# Patient Record
Sex: Male | Born: 1965 | Race: Black or African American | Hispanic: No | Marital: Single | Smoking: Never smoker
Health system: Southern US, Community
[De-identification: ages and names within clinical notes are randomized; demographics above are authoritative.]

## PROBLEM LIST (undated history)

## (undated) DIAGNOSIS — M1990 Unspecified osteoarthritis, unspecified site: Secondary | ICD-10-CM

## (undated) DIAGNOSIS — E559 Vitamin D deficiency, unspecified: Secondary | ICD-10-CM

## (undated) DIAGNOSIS — K219 Gastro-esophageal reflux disease without esophagitis: Secondary | ICD-10-CM

## (undated) DIAGNOSIS — T7840XA Allergy, unspecified, initial encounter: Secondary | ICD-10-CM

## (undated) DIAGNOSIS — R7303 Prediabetes: Secondary | ICD-10-CM

## (undated) DIAGNOSIS — E785 Hyperlipidemia, unspecified: Secondary | ICD-10-CM

## (undated) HISTORY — DX: Vitamin D deficiency, unspecified: E55.9

## (undated) HISTORY — DX: Unspecified osteoarthritis, unspecified site: M19.90

## (undated) HISTORY — PX: OTHER SURGICAL HISTORY: SHX169

## (undated) HISTORY — PX: ORBITAL FRACTURE SURGERY: SHX725

## (undated) HISTORY — DX: Allergy, unspecified, initial encounter: T78.40XA

## (undated) HISTORY — DX: Gastro-esophageal reflux disease without esophagitis: K21.9

## (undated) HISTORY — DX: Hyperlipidemia, unspecified: E78.5

## (undated) HISTORY — DX: Prediabetes: R73.03

---

## 2013-08-31 ENCOUNTER — Encounter: Payer: Self-pay | Admitting: Physician Assistant

## 2013-09-01 ENCOUNTER — Ambulatory Visit (HOSPITAL_COMMUNITY)
Admission: RE | Admit: 2013-09-01 | Discharge: 2013-09-01 | Disposition: A | Discharge status: Home / Self Care | Payer: BC Managed Care – PPO | Source: Ambulatory Visit | Attending: Physician Assistant | Admitting: Physician Assistant

## 2013-09-01 ENCOUNTER — Encounter: Payer: Self-pay | Admitting: Physician Assistant

## 2013-09-01 ENCOUNTER — Ambulatory Visit (INDEPENDENT_AMBULATORY_CARE_PROVIDER_SITE_OTHER): Payer: BC Managed Care – PPO | Admitting: Physician Assistant

## 2013-09-01 VITALS — BP 120/68 | HR 72 | Temp 98.8°F | Resp 16 | Ht 69.0 in | Wt 178.0 lb

## 2013-09-01 DIAGNOSIS — G4710 Hypersomnia, unspecified: Secondary | ICD-10-CM

## 2013-09-01 DIAGNOSIS — Z1211 Encounter for screening for malignant neoplasm of colon: Secondary | ICD-10-CM

## 2013-09-01 DIAGNOSIS — E559 Vitamin D deficiency, unspecified: Secondary | ICD-10-CM | POA: Insufficient documentation

## 2013-09-01 DIAGNOSIS — M1990 Unspecified osteoarthritis, unspecified site: Secondary | ICD-10-CM

## 2013-09-01 DIAGNOSIS — E785 Hyperlipidemia, unspecified: Secondary | ICD-10-CM | POA: Insufficient documentation

## 2013-09-01 DIAGNOSIS — I10 Essential (primary) hypertension: Secondary | ICD-10-CM | POA: Insufficient documentation

## 2013-09-01 DIAGNOSIS — Z0000 Encounter for general adult medical examination without abnormal findings: Secondary | ICD-10-CM

## 2013-09-01 DIAGNOSIS — Z79899 Other long term (current) drug therapy: Secondary | ICD-10-CM

## 2013-09-01 DIAGNOSIS — G4730 Sleep apnea, unspecified: Secondary | ICD-10-CM

## 2013-09-01 DIAGNOSIS — R7303 Prediabetes: Secondary | ICD-10-CM

## 2013-09-01 LAB — CBC WITH DIFFERENTIAL/PLATELET
BASOS PCT: 3 % — AB (ref 0–1)
Basophils Absolute: 0.1 10*3/uL (ref 0.0–0.1)
EOS PCT: 8 % — AB (ref 0–5)
Eosinophils Absolute: 0.3 10*3/uL (ref 0.0–0.7)
HEMATOCRIT: 40.3 % (ref 39.0–52.0)
HEMOGLOBIN: 13.9 g/dL (ref 13.0–17.0)
Lymphocytes Relative: 36 % (ref 12–46)
Lymphs Abs: 1.6 10*3/uL (ref 0.7–4.0)
MCH: 30.5 pg (ref 26.0–34.0)
MCHC: 34.5 g/dL (ref 30.0–36.0)
MCV: 88.4 fL (ref 78.0–100.0)
Monocytes Absolute: 0.4 10*3/uL (ref 0.1–1.0)
Monocytes Relative: 8 % (ref 3–12)
Neutro Abs: 2 10*3/uL (ref 1.7–7.7)
Neutrophils Relative %: 45 % (ref 43–77)
Platelets: 227 10*3/uL (ref 150–400)
RBC: 4.56 MIL/uL (ref 4.22–5.81)
RDW: 13.3 % (ref 11.5–15.5)
WBC: 4.3 10*3/uL (ref 4.0–10.5)

## 2013-09-01 MED ORDER — MELOXICAM 15 MG PO TABS: 15.0000 mg | ORAL_TABLET | Freq: Every day | ORAL | Status: DC

## 2013-09-01 MED ORDER — ALPRAZOLAM 0.5 MG PO TABS: ORAL_TABLET | ORAL | Status: DC

## 2013-09-01 NOTE — Patient Instructions (Addendum)
Cholesterol Cholesterol is a white, waxy, fat-like protein needed by your body in small amounts. The liver makes all the cholesterol you need. It is carried from the liver by the blood through the blood vessels. Deposits (plaque) may build up on blood vessel walls. This makes the arteries narrower and stiffer. Plaque increases the risk for heart attack and stroke. You cannot feel your cholesterol level even if it is very high. The only way to know is by a blood test to check your lipid (fats) levels. Once you know your cholesterol levels, you should keep a record of the test results. Work with your caregiver to to keep your levels in the desired range. WHAT THE RESULTS MEAN:  Total cholesterol is a rough measure of all the cholesterol in your blood.  LDL is the so-called bad cholesterol. This is the type that deposits cholesterol in the walls of the arteries. You want this level to be low.  HDL is the good cholesterol because it cleans the arteries and carries the LDL away. You want this level to be high.  Triglycerides are fat that the body can either burn for energy or store. High levels are closely linked to heart disease. DESIRED LEVELS:  Total cholesterol below 200.  LDL below 100 for people at risk, below 70 for very high risk.  HDL above 50 is good, above 60 is best.  Triglycerides below 150. HOW TO LOWER YOUR CHOLESTEROL:  Diet.  Choose fish or white meat chicken and [place], roasted or baked. Limit fatty cuts of red meat, fried foods, and processed meats, such as sausage and lunch meat.  Eat lots of fresh fruits and vegetables. Choose whole grains, beans, pasta, potatoes and cereals.  Use only small amounts of olive, corn or canola oils. Avoid butter, mayonnaise, shortening or palm kernel oils. Avoid foods with trans-fats.  Use skim/nonfat milk and low-fat/nonfat yogurt and cheeses. Avoid whole milk, cream, ice cream, egg yolks and cheeses. Healthy desserts include angel food  cake, ginger snaps, animal crackers, hard candy, popsicles, and low-fat/nonfat frozen yogurt. Avoid pastries, cakes, pies and cookies.  Exercise.  A regular program helps decrease LDL and raises HDL.  Helps with weight control.  Do things that increase your activity level like gardening, walking, or taking the stairs.  Medication.  May be prescribed by your caregiver to help lowering cholesterol and the risk for heart disease.  You may need medicine even if your levels are normal if you have several risk factors. HOME CARE INSTRUCTIONS   Follow your diet and exercise programs as suggested by your caregiver.  Take medications as directed.  Have blood work done when your caregiver feels it is necessary. MAKE SURE YOU:   Understand these instructions.  Will watch your condition.  Will get help right away if you are not doing well or get worse. Document Released: 04/23/2001 Document Revised: 10/21/2011 Document Reviewed: 05/12/2013 [facility] Patient Information 2014 [place], [place]. Sleep Apnea  Sleep apnea is a sleep disorder characterized by abnormal pauses in breathing while you sleep. When your breathing pauses, the level of oxygen in your blood decreases. This causes you to move out of deep sleep and into light sleep. As a result, your quality of sleep is poor, and the system that carries your blood throughout your body (cardiovascular system) experiences stress. If sleep apnea remains untreated, the following conditions can develop:  High blood pressure (hypertension).  Coronary artery disease.  Inability to achieve or maintain an erection (impotence).  Impairment  of your thought process (cognitive dysfunction). There are three types of sleep apnea: 1. Obstructive sleep apnea Pauses in breathing during sleep because of a blocked airway. 2. Central sleep apnea Pauses in breathing during sleep because the area of the brain that controls your breathing does not send the  correct signals to the muscles that control breathing. 3. Mixed sleep apnea A combination of both obstructive and central sleep apnea. RISK FACTORS The following risk factors can increase your risk of developing sleep apnea:  Being overweight.  Smoking.  Having narrow passages in your nose and throat.  Being of older age.  Being male.  Alcohol use.  Sedative and tranquilizer use.  Ethnicity. Among individuals younger than 35 years, African Americans are at increased risk of sleep apnea. SYMPTOMS   Difficulty staying asleep.  Daytime sleepiness and fatigue.  Loss of energy.  Irritability.  Loud, heavy snoring.  Morning headaches.  Trouble concentrating.  Forgetfulness.  Decreased interest in sex. DIAGNOSIS  In order to diagnose sleep apnea, your caregiver will perform a physical examination. Your caregiver may suggest that you take a home sleep test. Your caregiver may also recommend that you spend the night in a sleep lab. In the sleep lab, several monitors record information about your heart, lungs, and brain while you sleep. Your leg and arm movements and blood oxygen level are also recorded. TREATMENT The following actions may help to resolve mild sleep apnea:  Sleeping on your side.   Using a decongestant if you have nasal congestion.   Avoiding the use of depressants, including alcohol, sedatives, and narcotics.   Losing weight and modifying your diet if you are overweight. There also are devices and treatments to help open your airway:  Oral appliances. These are custom-made mouthpieces that shift your lower jaw forward and slightly open your bite. This opens your airway.  Devices that create positive airway pressure. This positive pressure "splints" your airway open to help you breathe better during sleep. The following devices create positive airway pressure:  Continuous positive airway pressure (CPAP) device. The CPAP device creates a continuous  level of air pressure with an air pump. The air is delivered to your airway through a mask while you sleep. This continuous pressure keeps your airway open.  Nasal expiratory positive airway pressure (EPAP) device. The EPAP device creates positive air pressure as you exhale. The device consists of single-use valves, which are inserted into each nostril and held in place by adhesive. The valves create very little resistance when you inhale but create much more resistance when you exhale. That increased resistance creates the positive airway pressure. This positive pressure while you exhale keeps your airway open, making it easier to breath when you inhale again.  Bilevel positive airway pressure (BPAP) device. The BPAP device is used mainly in patients with central sleep apnea. This device is similar to the CPAP device because it also uses an air pump to deliver continuous air pressure through a mask. However, with the BPAP machine, the pressure is set at two different levels. The pressure when you exhale is lower than the pressure when you inhale.  Surgery. Typically, surgery is only done if you cannot comply with less invasive treatments or if the less invasive treatments do not improve your condition. Surgery involves removing excess tissue in your airway to create a wider passage way. Document Released: 07/19/2002 Document Revised: 11/23/2012 Document Reviewed: 12/05/2011 [facility] Patient Information 2014 [place].

## 2013-09-01 NOTE — Progress Notes (Signed)
Complete Physical HPI Patient presents for complete physical.   Patient's blood pressure has been controlled at home. Patient denies chest pain, shortness of breath, dizziness. BP: 120/68 mmHg  Patient's cholesterol is diet controlled. The patient's cholesterol last visit was LDL 118.  The patient has been working on diet and exercise for prediabetes, denies changes in vision, polys, and paresthesias. Last A1C in office was A1C 5.8.  He has been working two jobs and only getting 2-3 hours of sleep at times. He has a hard time getting to sleep with difference swing shifts and wakes himself up gasping for air and snoring sometimes. He has fatigue during the day.  Has arthritis and mobic helps.   Current Medications:  Current Outpatient Prescriptions on File Prior to Visit  Medication Sig Dispense Refill  . meloxicam (MOBIC) 15 MG tablet Take 15 mg by mouth daily.       No current facility-administered medications on file prior to visit.   Health Maintenance:  Tetanus: 2010  Pneumovax: N/A Flu vaccine: N/A Zostavax: N/A DEXA: N/A Colonoscopy: N/A EGD: N/A  Allergies: No Known Allergies Medical History:  Past Medical History  Diagnosis Date  . Prediabetes   . Vitamin D deficiency   . Arthritis   . Hyperlipidemia    Surgical History:  Past Surgical History  Procedure Laterality Date  . Orbital fracture surgery Left    Family History:  Family History  Problem Relation Age of Onset  . Arthritis Mother    Social History:  History   Social History  . Marital Status: Single    Spouse Name: N/A    Number of Children: N/A  . Years of Education: N/A   Occupational History  . Not on file.   Social History Main Topics  . Smoking status: Never Smoker   . Smokeless tobacco: Never Used  . Alcohol Use: Yes     Comment: weekends  . Drug Use: No  . Sexual Activity: Not on file   Other Topics Concern  . Not on file   Social History Narrative  . No narrative on file    ROS Constitutional: + insomnia Denies weight loss/gain, headaches, insomnia, fatigue, night sweats, and change in appetite. Eyes: Denies redness, blurred vision, diplopia, discharge, itchy, watery eyes.  ENT: + snoring and will wake himself up  Denies discharge, congestion, post nasal drip, sore throat, earache, hearing loss, dental pain, Tinnitus, Vertigo, Sinus pain Cardio: Denies chest pain, palpitations, irregular heartbeat, dyspnea, diaphoresis, orthopnea, PND, claudication, edema Respiratory: denies cough, dyspnea, pleurisy, hoarseness, wheezing.  Gastrointestinal: Denies dysphagia, heartburn, pain, cramps, nausea, vomiting, bloating, diarrhea, constipation, hematemesis, melena, hematochezia, hemorrhoids Genitourinary: + ED intermittently Denies dysuria, frequency, urgency, nocturia, hesitancy, discharge, hematuria, flank pain Musculoskeletal: + muscle aches, + knee and joint pain from his job  Denies arthralgia, myalgia, stiffness, Jt. Swelling, pain, Skin: Denies pruritis, rash, hives,  acne, eczema, changing in skin lesion Neuro: Denies Weakness, tremor, incoordination, spasms, paresthesia, pain Psychiatric: Denies confusion, memory loss, sensory loss Endocrine: Denies change in weight, skin, hair change, nocturia, and paresthesia, Diabetic Denies Polys, visual blurring, hyper /hypo glycemic episodes.  Heme/Lymph: Denies Excessive bleeding, bruising, enlarged lymph nodes  Physical Exam: Estimated body mass index is 26.27 kg/(m^2) as calculated from the following:   Height as of this encounter: 5\' 9"  (1.753 m).   Weight as of this encounter: 178 lb (80.74 kg). Filed Vitals:   09/01/13 1418  BP: 120/68  Pulse: 72  Temp: 98.8 F (37.1 C)  Resp:  16   General Appearance: Well nourished, in no apparent distress. Eyes: PERRLA, EOMs, conjunctiva no swelling or erythema, normal fundi and vessels. Sinuses: No Frontal/maxillary tenderness ENT/Mouth: Ext aud canals clear, normal light  reflex with TMs without erythema, bulging. Good dentition. No erythema, swelling, or exudate on post pharynx. Tonsils not swollen or erythematous. Hearing normal. Very crowded mouth.  Neck: Supple, thyroid normal. No bruits Respiratory: Respiratory effort normal, BS equal bilaterally without rales, rhonchi, wheezing or stridor. Cardio: RRR without murmurs, rubs or gallops. Brisk peripheral pulses without edema.  Chest: symmetric, with normal excursions and percussion. Abdomen: Soft, +BS. Non tender, no guarding, rebound, hernias, masses, or organomegaly. .  Lymphatics: Non tender without lymphadenopathy.  Genitourinary: defer Musculoskeletal: Full ROM all peripheral extremities,5/5 strength, and normal gait. Skin: Warm, dry without rashes, lesions, ecchymosis.  Neuro: Cranial nerves intact, reflexes equal bilaterally. Normal muscle tone, no cerebellar symptoms. Sensation intact.  Psych: Awake and oriented X 3, normal affect, Insight and Judgment appropriate.   EKG: EKG with questionable LAE and LVH/hyperacute T waves  Assessment and Plan: Prediabetes- check level, continue diet and exercise  Vitamin D deficiency- check level  Arthritis- takes meloxicam and helps  Hyperlipidemia- check level continue diet and exercise.   Insomnia- check labs, good sleep hygeine, xanax 0.5 1/2-1 QHS PRN.  OSA, fatigue, wakes self up, ED, HTN- get sleep study. EKG-   Normal chest xray so likely body habitus/lead placement Health Maintenance  Discussed med's effects and SE's. Screening labs and tests as requested with regular follow-up as recommended.  Vicie Mutters 2:26 PM

## 2013-09-02 LAB — BASIC METABOLIC PANEL WITH GFR
BUN: 15 mg/dL (ref 6–23)
CO2: 30 meq/L (ref 19–32)
CREATININE: 0.93 mg/dL (ref 0.50–1.35)
Calcium: 9.3 mg/dL (ref 8.4–10.5)
Chloride: 101 mEq/L (ref 96–112)
GFR, Est Non African American: >89 mL/min
Glucose, Bld: 87 mg/dL (ref 70–99)
Potassium: 4.2 mEq/L (ref 3.5–5.3)
Sodium: 136 mEq/L (ref 135–145)

## 2013-09-02 LAB — MICROALBUMIN / CREATININE URINE RATIO
Creatinine, Urine: 279.9 mg/dL
MICROALB UR: 0.92 mg/dL (ref 0.00–1.89)
Microalb Creat Ratio: 3.3 mg/g (ref 0.0–30.0)

## 2013-09-02 LAB — IRON AND TIBC
%SAT: 30 % (ref 20–55)
Iron: 83 ug/dL (ref 42–165)
TIBC: 280 ug/dL (ref 215–435)
UIBC: 197 ug/dL (ref 125–400)

## 2013-09-02 LAB — HEPATIC FUNCTION PANEL
ALT: 18 U/L (ref 0–53)
AST: 24 U/L (ref 0–37)
Albumin: 4.2 g/dL (ref 3.5–5.2)
Alkaline Phosphatase: 35 U/L — ABNORMAL LOW (ref 39–117)
Bilirubin, Direct: 0.1 mg/dL (ref 0.0–0.3)
Indirect Bilirubin: 0.3 mg/dL (ref 0.0–0.9)
TOTAL PROTEIN: 6.5 g/dL (ref 6.0–8.3)
Total Bilirubin: 0.4 mg/dL (ref 0.3–1.2)

## 2013-09-02 LAB — LIPID PANEL
CHOLESTEROL: 164 mg/dL (ref 0–200)
HDL: 55 mg/dL (ref >39)
LDL CALC: 94 mg/dL (ref 0–99)
TRIGLYCERIDES: 74 mg/dL (ref <150)
Total CHOL/HDL Ratio: 3 Ratio
VLDL: 15 mg/dL (ref 0–40)

## 2013-09-02 LAB — INSULIN, FASTING: INSULIN FASTING, SERUM: 4 u[IU]/mL (ref 3–28)

## 2013-09-02 LAB — URINALYSIS, ROUTINE W REFLEX MICROSCOPIC
BILIRUBIN URINE: NEGATIVE
GLUCOSE, UA: NEGATIVE mg/dL
Hgb urine dipstick: NEGATIVE
Ketones, ur: NEGATIVE mg/dL
Leukocytes, UA: NEGATIVE
Nitrite: NEGATIVE
PROTEIN: NEGATIVE mg/dL
Specific Gravity, Urine: 1.03 (ref 1.005–1.030)
UROBILINOGEN UA: 1 mg/dL (ref 0.0–1.0)
pH: 7.5 (ref 5.0–8.0)

## 2013-09-02 LAB — HEMOGLOBIN A1C
Hgb A1c MFr Bld: 5.9 % — ABNORMAL HIGH (ref <5.7)
Mean Plasma Glucose: 123 mg/dL — ABNORMAL HIGH (ref <117)

## 2013-09-02 LAB — FERRITIN: Ferritin: 171 ng/mL (ref 22–322)

## 2013-09-02 LAB — VITAMIN D 25 HYDROXY (VIT D DEFICIENCY, FRACTURES): Vit D, 25-Hydroxy: 70 ng/mL (ref 30–89)

## 2013-09-02 LAB — MAGNESIUM: Magnesium: 1.9 mg/dL (ref 1.5–2.5)

## 2013-09-02 LAB — TESTOSTERONE: TESTOSTERONE: 457 ng/dL (ref 300–890)

## 2013-09-02 LAB — TSH: TSH: 1.405 u[IU]/mL (ref 0.350–4.500)

## 2013-09-02 LAB — VITAMIN B12: VITAMIN B 12: 737 pg/mL (ref 211–911)

## 2013-09-21 ENCOUNTER — Other Ambulatory Visit: Payer: Self-pay | Admitting: Physician Assistant

## 2013-12-23 ENCOUNTER — Other Ambulatory Visit: Payer: Self-pay | Admitting: Physician Assistant

## 2014-01-27 ENCOUNTER — Other Ambulatory Visit: Payer: Self-pay | Admitting: Emergency Medicine

## 2014-03-01 ENCOUNTER — Ambulatory Visit: Payer: Self-pay | Admitting: Physician Assistant

## 2014-03-21 ENCOUNTER — Encounter: Payer: Self-pay | Admitting: Physician Assistant

## 2014-03-21 ENCOUNTER — Ambulatory Visit (INDEPENDENT_AMBULATORY_CARE_PROVIDER_SITE_OTHER): Payer: BC Managed Care – PPO | Admitting: Physician Assistant

## 2014-03-21 VITALS — BP 120/78 | HR 72 | Temp 98.6°F | Resp 16 | Ht 69.0 in | Wt 172.0 lb

## 2014-03-21 DIAGNOSIS — R7309 Other abnormal glucose: Secondary | ICD-10-CM

## 2014-03-21 DIAGNOSIS — R7303 Prediabetes: Secondary | ICD-10-CM

## 2014-03-21 DIAGNOSIS — E785 Hyperlipidemia, unspecified: Secondary | ICD-10-CM

## 2014-03-21 DIAGNOSIS — Z79899 Other long term (current) drug therapy: Secondary | ICD-10-CM

## 2014-03-21 DIAGNOSIS — E559 Vitamin D deficiency, unspecified: Secondary | ICD-10-CM

## 2014-03-21 LAB — CBC WITH DIFFERENTIAL/PLATELET
BASOS ABS: 0.1 10*3/uL (ref 0.0–0.1)
Basophils Relative: 2 % — ABNORMAL HIGH (ref 0–1)
EOS ABS: 0.3 10*3/uL (ref 0.0–0.7)
Eosinophils Relative: 6 % — ABNORMAL HIGH (ref 0–5)
HCT: 40 % (ref 39.0–52.0)
Hemoglobin: 13.6 g/dL (ref 13.0–17.0)
Lymphocytes Relative: 29 % (ref 12–46)
Lymphs Abs: 1.3 10*3/uL (ref 0.7–4.0)
MCH: 30.2 pg (ref 26.0–34.0)
MCHC: 34 g/dL (ref 30.0–36.0)
MCV: 88.7 fL (ref 78.0–100.0)
Monocytes Absolute: 0.5 10*3/uL (ref 0.1–1.0)
Monocytes Relative: 10 % (ref 3–12)
NEUTROS ABS: 2.4 10*3/uL (ref 1.7–7.7)
NEUTROS PCT: 53 % (ref 43–77)
Platelets: 240 10*3/uL (ref 150–400)
RBC: 4.51 MIL/uL (ref 4.22–5.81)
RDW: 13.6 % (ref 11.5–15.5)
WBC: 4.5 10*3/uL (ref 4.0–10.5)

## 2014-03-21 MED ORDER — ALPRAZOLAM 0.5 MG PO TABS: ORAL_TABLET | ORAL | Status: DC

## 2014-03-21 NOTE — Patient Instructions (Signed)
Bad carbs also include fruit juice, alcohol, and sweet tea. These are empty calories that do not signal to your brain that you are full.   Please remember the good carbs are still carbs which convert into sugar. So please measure them out no more than 1/2-1 cup of rice, oatmeal, pasta, and beans.  Veggies are however free foods! Pile them on.   I like lean protein at every meal such as chicken, [place], pork chops, cottage cheese, etc. Just do not fry these meats and please center your meal around vegetable, the meats should be a side dish.   No all fruit is created equal. Please see the list below, the fruit at the bottom is higher in sugars than the fruit at the top    Sleep Apnea  Sleep apnea is a sleep disorder characterized by abnormal pauses in breathing while you sleep. When your breathing pauses, the level of oxygen in your blood decreases. This causes you to move out of deep sleep and into light sleep. As a result, your quality of sleep is poor, and the system that carries your blood throughout your body (cardiovascular system) experiences stress. If sleep apnea remains untreated, the following conditions can develop:  High blood pressure (hypertension).  Coronary artery disease.  Inability to achieve or maintain an erection (impotence).  Impairment of your thought process (cognitive dysfunction). There are three types of sleep apnea: 1. Obstructive sleep apnea--Pauses in breathing during sleep because of a blocked airway. 2. Central sleep apnea--Pauses in breathing during sleep because the area of the brain that controls your breathing does not send the correct signals to the muscles that control breathing. 3. Mixed sleep apnea--A combination of both obstructive and central sleep apnea. RISK FACTORS The following risk factors can increase your risk of developing sleep apnea:  Being overweight.  Smoking.  Having narrow passages in your nose and throat.  Being of older  age.  Being male.  Alcohol use.  Sedative and tranquilizer use.  Ethnicity. Among individuals younger than 35 years, African Americans are at increased risk of sleep apnea. SYMPTOMS   Difficulty staying asleep.  Daytime sleepiness and fatigue.  Loss of energy.  Irritability.  Loud, heavy snoring.  Morning headaches.  Trouble concentrating.  Forgetfulness.  Decreased interest in sex. DIAGNOSIS  In order to diagnose sleep apnea, your caregiver will perform a physical examination. Your caregiver may suggest that you take a home sleep test. Your caregiver may also recommend that you spend the night in a sleep lab. In the sleep lab, several monitors record information about your heart, lungs, and brain while you sleep. Your leg and arm movements and blood oxygen level are also recorded. TREATMENT The following actions may help to resolve mild sleep apnea:  Sleeping on your side.   Using a decongestant if you have nasal congestion.   Avoiding the use of depressants, including alcohol, sedatives, and narcotics.   Losing weight and modifying your diet if you are overweight. There also are devices and treatments to help open your airway:  Oral appliances. These are custom-made mouthpieces that shift your lower jaw forward and slightly open your bite. This opens your airway.  Devices that create positive airway pressure. This positive pressure "splints" your airway open to help you breathe better during sleep. The following devices create positive airway pressure:  Continuous positive airway pressure (CPAP) device. The CPAP device creates a continuous level of air pressure with an air pump. The air is delivered to  your airway through a mask while you sleep. This continuous pressure keeps your airway open.  Nasal expiratory positive airway pressure (EPAP) device. The EPAP device creates positive air pressure as you exhale. The device consists of single-use valves, which are  inserted into each nostril and held in place by adhesive. The valves create very little resistance when you inhale but create much more resistance when you exhale. That increased resistance creates the positive airway pressure. This positive pressure while you exhale keeps your airway open, making it easier to breath when you inhale again.  Bilevel positive airway pressure (BPAP) device. The BPAP device is used mainly in patients with central sleep apnea. This device is similar to the CPAP device because it also uses an air pump to deliver continuous air pressure through a mask. However, with the BPAP machine, the pressure is set at two different levels. The pressure when you exhale is lower than the pressure when you inhale.  Surgery. Typically, surgery is only done if you cannot comply with less invasive treatments or if the less invasive treatments do not improve your condition. Surgery involves removing excess tissue in your airway to create a wider passage way. Document Released: 07/19/2002 Document Revised: 11/23/2012 Document Reviewed: 12/05/2011 [facility] Patient Information 2015 [place], [place]. This information is not intended to replace advice given to you by your health care provider. Make sure you discuss any questions you have with your health care provider.

## 2014-03-21 NOTE — Progress Notes (Signed)
Assessment and Plan:  Hypertension: Continue medication, monitor blood pressure at home. Continue DASH diet. Cholesterol: Continue diet and exercise. Check cholesterol.  Pre-diabetes-Continue diet and exercise. Check A1C Vitamin D Def- check level and continue medications.   Continue diet and meds as discussed. Further disposition pending results of labs.  HPI 48 y.o. male  presents for 3 month follow up with hypertension, hyperlipidemia, prediabetes and vitamin D. His blood pressure has been controlled at home, today their BP is BP: 120/78 mmHg He does not workout. He denies chest pain, shortness of breath, dizziness.  He is not on cholesterol medication and denies myalgias. His cholesterol is at goal. The cholesterol last visit was:   Lab Results  Component Value Date   CHOL 164 09/01/2013   HDL 55 09/01/2013   LDLCALC 94 09/01/2013   TRIG 74 09/01/2013   CHOLHDL 3.0 09/01/2013   He has been working on diet and exercise for prediabetes, and denies paresthesia of the feet, polydipsia and polyuria. Last A1C in the office was:  Lab Results  Component Value Date   HGBA1C 5.9* 09/01/2013   Patient is on Vitamin D supplement.   Lab Results  Component Value Date   VD25OH 76 09/01/2013       Current Medications:  Current Outpatient Prescriptions on File Prior to Visit  Medication Sig Dispense Refill  . ALPRAZolam (XANAX) 0.5 MG tablet 1/2-1 pill as needed at night for sleep  30 tablet  0  . meloxicam (MOBIC) 15 MG tablet TAKE 1 TABLET EVERY DAY  90 tablet  0  . meloxicam (MOBIC) 15 MG tablet TAKE 1 TABLET BY MOUTH EVERY DAY AS NEEDED FOR PAIN WITH FOOD  90 tablet  0   No current facility-administered medications on file prior to visit.   Medical History:  Past Medical History  Diagnosis Date  . Prediabetes   . Vitamin D deficiency   . Arthritis   . Hyperlipidemia    Allergies: No Known Allergies   Review of Systems: [X]  = complains of  [ ]  = denies  General: Fatigue [ ]  Fever  [ ]  Chills [ ]  Weakness [ ]   Insomnia [ ]  Eyes: Redness [ ]  Blurred vision [ ]  Diplopia [ ]   ENT: Congestion [ ]  Sinus Pain [ ]  Post Nasal Drip [ ]  Sore Throat [ ]  Earache [ ]   Cardiac: Chest pain/pressure [ ]  SOB [ ]  Orthopnea [ ]   Palpitations [ ]   Paroxysmal nocturnal dyspnea[ ]  Claudication [ ]  Edema [ ]   Pulmonary: Cough [ ]  Wheezing[ ]   SOB [ ]   Snoring [ ]   GI: Nausea [ ]  Vomiting[ ]  Dysphagia[ ]  Heartburn[ ]  Abdominal pain [ ]  Constipation [ ] ; Diarrhea [ ] ; BRBPR [ ]  Melena[ ]  GU: Hematuria[ ]  Dysuria [ ]  Nocturia[ ]  Urgency [ ]   Hesitancy [ ]  Discharge [ ]  Neuro: Headaches[ ]  Vertigo[ ]  Paresthesias[ ]  Spasm [ ]  Speech changes [ ]  Incoordination [ ]   Ortho: Arthritis [ ]  Joint pain [ ]  Muscle pain, cramping occ at night  [x ] Joint swelling [ ]  Back Pain [ ]  Skin:  Rash [ ]   Pruritis [ ]  Change in skin lesion [ ]   Psych: Depression[ ]  Anxiety[ ]  Confusion [ ]  Memory loss [ ]   Heme/Lypmh: Bleeding [ ]  Bruising [ ]  Enlarged lymph nodes [ ]   Endocrine: Visual blurring [ ]  Paresthesia [ ]  Polyuria [ ]  Polydypsea [ ]    Heat/cold intolerance [ ]  Hypoglycemia [ ]   Family history- Review and unchanged Social history- Review and unchanged Physical Exam: BP 120/78  Pulse 72  Temp(Src) 98.6 F (37 C)  Resp 16  Ht 5\' 9"  (1.753 m)  Wt 172 lb (78.019 kg)  BMI 25.39 kg/m2 Wt Readings from Last 3 Encounters:  03/21/14 172 lb (78.019 kg)  09/01/13 178 lb (80.74 kg)   General Appearance: Well nourished, in no apparent distress. Eyes: PERRLA, EOMs, conjunctiva no swelling or erythema Sinuses: No Frontal/maxillary tenderness ENT/Mouth: Ext aud canals clear, TMs without erythema, bulging. No erythema, swelling, or exudate on post pharynx.  Tonsils not swollen or erythematous. Hearing normal.  Neck: Supple, thyroid normal.  Respiratory: Respiratory effort normal, BS equal bilaterally without rales, rhonchi, wheezing or stridor.  Cardio: RRR with no MRGs. Brisk peripheral pulses without edema.   Abdomen: Soft, + BS.  Non tender, no guarding, rebound, hernias, masses. Lymphatics: Non tender without lymphadenopathy.  Musculoskeletal: Full ROM, 5/5 strength, normal gait.  Skin: Warm, dry without rashes, lesions, ecchymosis.  Neuro: Cranial nerves intact. Normal muscle tone, no cerebellar symptoms. Sensation intact.  Psych: Awake and oriented X 3, normal affect, Insight and Judgment appropriate.    Vicie Mutters 4:00 PM

## 2014-03-22 LAB — BASIC METABOLIC PANEL WITH GFR
BUN: 14 mg/dL (ref 6–23)
CO2: 26 mEq/L (ref 19–32)
Calcium: 9.3 mg/dL (ref 8.4–10.5)
Chloride: 102 mEq/L (ref 96–112)
Creat: 0.96 mg/dL (ref 0.50–1.35)
GLUCOSE: 99 mg/dL (ref 70–99)
POTASSIUM: 4.5 meq/L (ref 3.5–5.3)
SODIUM: 137 meq/L (ref 135–145)

## 2014-03-22 LAB — HEPATIC FUNCTION PANEL
ALT: 16 U/L (ref 0–53)
AST: 20 U/L (ref 0–37)
Albumin: 4.6 g/dL (ref 3.5–5.2)
Alkaline Phosphatase: 34 U/L — ABNORMAL LOW (ref 39–117)
BILIRUBIN DIRECT: 0.1 mg/dL (ref 0.0–0.3)
BILIRUBIN INDIRECT: 0.3 mg/dL (ref 0.2–1.2)
TOTAL PROTEIN: 6.8 g/dL (ref 6.0–8.3)
Total Bilirubin: 0.4 mg/dL (ref 0.2–1.2)

## 2014-03-22 LAB — LIPID PANEL
CHOLESTEROL: 194 mg/dL (ref 0–200)
HDL: 65 mg/dL (ref >39)
LDL CALC: 111 mg/dL — AB (ref 0–99)
Total CHOL/HDL Ratio: 3 Ratio
Triglycerides: 88 mg/dL (ref <150)
VLDL: 18 mg/dL (ref 0–40)

## 2014-03-22 LAB — VITAMIN D 25 HYDROXY (VIT D DEFICIENCY, FRACTURES): VIT D 25 HYDROXY: 43 ng/mL (ref 30–89)

## 2014-03-22 LAB — HEMOGLOBIN A1C
Hgb A1c MFr Bld: 5.6 % (ref <5.7)
MEAN PLASMA GLUCOSE: 114 mg/dL (ref <117)

## 2014-03-22 LAB — TSH: TSH: 1.828 u[IU]/mL (ref 0.350–4.500)

## 2014-03-22 LAB — MAGNESIUM: Magnesium: 2 mg/dL (ref 1.5–2.5)

## 2014-04-25 ENCOUNTER — Other Ambulatory Visit: Payer: Self-pay | Admitting: Emergency Medicine

## 2014-08-13 ENCOUNTER — Other Ambulatory Visit: Payer: Self-pay | Admitting: Internal Medicine

## 2014-09-05 ENCOUNTER — Encounter: Payer: Self-pay | Admitting: Physician Assistant

## 2014-09-05 ENCOUNTER — Ambulatory Visit (INDEPENDENT_AMBULATORY_CARE_PROVIDER_SITE_OTHER): Payer: BLUE CROSS/BLUE SHIELD | Admitting: Physician Assistant

## 2014-09-05 VITALS — BP 124/82 | HR 76 | Temp 98.2°F | Resp 16 | Ht 70.0 in | Wt 174.6 lb

## 2014-09-05 DIAGNOSIS — G4700 Insomnia, unspecified: Secondary | ICD-10-CM

## 2014-09-05 DIAGNOSIS — M1990 Unspecified osteoarthritis, unspecified site: Secondary | ICD-10-CM

## 2014-09-05 DIAGNOSIS — R7303 Prediabetes: Secondary | ICD-10-CM

## 2014-09-05 DIAGNOSIS — E559 Vitamin D deficiency, unspecified: Secondary | ICD-10-CM

## 2014-09-05 DIAGNOSIS — E785 Hyperlipidemia, unspecified: Secondary | ICD-10-CM

## 2014-09-05 DIAGNOSIS — N529 Male erectile dysfunction, unspecified: Secondary | ICD-10-CM

## 2014-09-05 DIAGNOSIS — Z79899 Other long term (current) drug therapy: Secondary | ICD-10-CM

## 2014-09-05 DIAGNOSIS — Z1212 Encounter for screening for malignant neoplasm of rectum: Secondary | ICD-10-CM

## 2014-09-05 DIAGNOSIS — R7309 Other abnormal glucose: Secondary | ICD-10-CM

## 2014-09-05 LAB — CBC WITH DIFFERENTIAL/PLATELET
BASOS PCT: 3 % — AB (ref 0–1)
Basophils Absolute: 0.1 10*3/uL (ref 0.0–0.1)
EOS ABS: 0.3 10*3/uL (ref 0.0–0.7)
EOS PCT: 6 % — AB (ref 0–5)
HCT: 41.9 % (ref 39.0–52.0)
HEMOGLOBIN: 14.5 g/dL (ref 13.0–17.0)
LYMPHS ABS: 1.5 10*3/uL (ref 0.7–4.0)
Lymphocytes Relative: 35 % (ref 12–46)
MCH: 30.8 pg (ref 26.0–34.0)
MCHC: 34.6 g/dL (ref 30.0–36.0)
MCV: 89 fL (ref 78.0–100.0)
MONOS PCT: 9 % (ref 3–12)
MPV: 10.7 fL (ref 8.6–12.4)
Monocytes Absolute: 0.4 10*3/uL (ref 0.1–1.0)
Neutro Abs: 2 10*3/uL (ref 1.7–7.7)
Neutrophils Relative %: 47 % (ref 43–77)
Platelets: 293 10*3/uL (ref 150–400)
RBC: 4.71 MIL/uL (ref 4.22–5.81)
RDW: 13.5 % (ref 11.5–15.5)
WBC: 4.3 10*3/uL (ref 4.0–10.5)

## 2014-09-05 LAB — HEMOGLOBIN A1C
HEMOGLOBIN A1C: 5.9 % — AB (ref <5.7)
Mean Plasma Glucose: 123 mg/dL — ABNORMAL HIGH (ref <117)

## 2014-09-05 MED ORDER — ALPRAZOLAM 0.5 MG PO TABS: ORAL_TABLET | ORAL | Status: DC

## 2014-09-05 NOTE — Patient Instructions (Signed)
Sleep Apnea  Sleep apnea is a sleep disorder characterized by abnormal pauses in breathing while you sleep. When your breathing pauses, the level of oxygen in your blood decreases. This causes you to move out of deep sleep and into light sleep. As a result, your quality of sleep is poor, and the system that carries your blood throughout your body (cardiovascular system) experiences stress. If sleep apnea remains untreated, the following conditions can develop:  High blood pressure (hypertension).  Coronary artery disease.  Inability to achieve or maintain an erection (impotence).  Impairment of your thought process (cognitive dysfunction). There are three types of sleep apnea: 1. Obstructive sleep apnea--Pauses in breathing during sleep because of a blocked airway. 2. Central sleep apnea--Pauses in breathing during sleep because the area of the brain that controls your breathing does not send the correct signals to the muscles that control breathing. 3. Mixed sleep apnea--A combination of both obstructive and central sleep apnea. RISK FACTORS The following risk factors can increase your risk of developing sleep apnea:  Being overweight.  Smoking.  Having narrow passages in your nose and throat.  Being of older age.  Being male.  Alcohol use.  Sedative and tranquilizer use.  Ethnicity. Among individuals younger than 35 years, African Americans are at increased risk of sleep apnea. SYMPTOMS   Difficulty staying asleep.  Daytime sleepiness and fatigue.  Loss of energy.  Irritability.  Loud, heavy snoring.  Morning headaches.  Trouble concentrating.  Forgetfulness.  Decreased interest in sex. DIAGNOSIS  In order to diagnose sleep apnea, your caregiver will perform a physical examination. Your caregiver may suggest that you take a home sleep test. Your caregiver may also recommend that you spend the night in a sleep lab. In the sleep lab, several monitors record  information about your heart, lungs, and brain while you sleep. Your leg and arm movements and blood oxygen level are also recorded. TREATMENT The following actions may help to resolve mild sleep apnea:  Sleeping on your side.   Using a decongestant if you have nasal congestion.   Avoiding the use of depressants, including alcohol, sedatives, and narcotics.   Losing weight and modifying your diet if you are overweight. There also are devices and treatments to help open your airway:  Oral appliances. These are custom-made mouthpieces that shift your lower jaw forward and slightly open your bite. This opens your airway.  Devices that create positive airway pressure. This positive pressure "splints" your airway open to help you breathe better during sleep. The following devices create positive airway pressure:  Continuous positive airway pressure (CPAP) device. The CPAP device creates a continuous level of air pressure with an air pump. The air is delivered to your airway through a mask while you sleep. This continuous pressure keeps your airway open.  Nasal expiratory positive airway pressure (EPAP) device. The EPAP device creates positive air pressure as you exhale. The device consists of single-use valves, which are inserted into each nostril and held in place by adhesive. The valves create very little resistance when you inhale but create much more resistance when you exhale. That increased resistance creates the positive airway pressure. This positive pressure while you exhale keeps your airway open, making it easier to breath when you inhale again.  Bilevel positive airway pressure (BPAP) device. The BPAP device is used mainly in patients with central sleep apnea. This device is similar to the CPAP device because it also uses an air pump to deliver continuous air pressure   through a mask. However, with the BPAP machine, the pressure is set at two different levels. The pressure when you  exhale is lower than the pressure when you inhale.  Surgery. Typically, surgery is only done if you cannot comply with less invasive treatments or if the less invasive treatments do not improve your condition. Surgery involves removing excess tissue in your airway to create a wider passage way. Document Released: 07/19/2002 Document Revised: 11/23/2012 Document Reviewed: 12/05/2011 [facility] Patient Information 2015 [place], [place]. This information is not intended to replace advice given to you by your health care provider. Make sure you discuss any questions you have with your health care provider. Can try melatonin 5mg -15 mg at night for sleep, can also do benadryl 25-50mg  at night for sleep.  If this does not help we can try prescription medication.  Also here is some information about good sleep hygiene.   Insomnia Insomnia is frequent trouble falling and/or staying asleep. Insomnia can be a long term problem or a short term problem. Both are common. Insomnia can be a short term problem when the wakefulness is related to a certain stress or worry. Long term insomnia is often related to ongoing stress during waking hours and/or poor sleeping habits. Overtime, sleep deprivation itself can make the problem worse. Every little thing feels more severe because you are overtired and your ability to cope is decreased. CAUSES   Stress, anxiety, and depression.  Poor sleeping habits.  Distractions such as TV in the bedroom.  Naps close to bedtime.  Engaging in emotionally charged conversations before bed.  Technical reading before sleep.  Alcohol and other sedatives. They may make the problem worse. They can hurt normal sleep patterns and normal dream activity.  Stimulants such as caffeine for several hours prior to bedtime.  Pain syndromes and shortness of breath can cause insomnia.  Exercise late at night.  Changing time zones may cause sleeping problems (jet lag). It is sometimes  helpful to have someone observe your sleeping patterns. They should look for periods of not breathing during the night (sleep apnea). They should also look to see how long those periods last. If you live alone or observers are uncertain, you can also be observed at a sleep clinic where your sleep patterns will be professionally monitored. Sleep apnea requires a checkup and treatment. Give your caregivers your medical history. Give your caregivers observations your family has made about your sleep.  SYMPTOMS  4. Not feeling rested in the morning. 5. Anxiety and restlessness at bedtime. 6. Difficulty falling and staying asleep. TREATMENT   Your caregiver may prescribe treatment for an underlying medical disorders. Your caregiver can give advice or help if you are using alcohol or other drugs for self-medication. Treatment of underlying problems will usually eliminate insomnia problems.  Medications can be prescribed for short time use. They are generally not recommended for lengthy use.  Over-the-counter sleep medicines are not recommended for lengthy use. They can be habit forming.  You can promote easier sleeping by making lifestyle changes such as:  Using relaxation techniques that help with breathing and reduce muscle tension.  Exercising earlier in the day.  Changing your diet and the time of your last meal. No night time snacks.  Establish a regular time to go to bed.  Counseling can help with stressful problems and worry.  Soothing music and white noise may be helpful if there are background noises you cannot remove.  Stop tedious detailed work at least one hour  before bedtime. HOME CARE INSTRUCTIONS   Keep a diary. Inform your caregiver about your progress. This includes any medication side effects. See your caregiver regularly. Take note of:  Times when you are asleep.  Times when you are awake during the night.  The quality of your sleep.  How you feel the next  day. This information will help your caregiver care for you.  Get out of bed if you are still awake after 15 minutes. Read or do some quiet activity. Keep the lights down. Wait until you feel sleepy and go back to bed.  Keep regular sleeping and waking hours. Avoid naps.  Exercise regularly.  Avoid distractions at bedtime. Distractions include watching television or engaging in any intense or detailed activity like attempting to balance the household checkbook.  Develop a bedtime ritual. Keep a familiar routine of bathing, brushing your teeth, climbing into bed at the same time each night, listening to soothing music. Routines increase the success of falling to sleep faster.  Use relaxation techniques. This can be using breathing and muscle tension release routines. It can also include visualizing peaceful scenes. You can also help control troubling or intruding thoughts by keeping your mind occupied with boring or repetitive thoughts like the old concept of counting sheep. You can make it more creative like imagining planting one beautiful flower after another in your backyard garden.  During your day, work to eliminate stress. When this is not possible use some of the previous suggestions to help reduce the anxiety that accompanies stressful situations. MAKE SURE YOU:   Understand these instructions.  Will watch your condition.  Will get help right away if you are not doing well or get worse. Document Released: 07/26/2000 Document Revised: 10/21/2011 Document Reviewed: 08/26/2007 [facility] Patient Information 2015 [place], [place]. This information is not intended to replace advice given to you by your health care provider. Make sure you discuss any questions you have with your health care provider.

## 2014-09-05 NOTE — Progress Notes (Signed)
Complete Physical  Assessment and Plan: 1. Prediabetes Discussed general issues about diabetes pathophysiology and management., Educational material distributed., Suggested low cholesterol diet., Encouraged aerobic exercise., Discussed foot care., Reminded to get yearly retinal exam. - CBC with Differential/Platelet - BASIC METABOLIC PANEL WITH GFR - Hepatic function panel - TSH - Hemoglobin A1c - Insulin, fasting - Urinalysis, Routine w reflex microscopic - EKG 12-Lead - Microalbumin / creatinine urine ratio  2. Hyperlipidemia -continue medications, check lipids, decrease fatty foods, increase activity.  - Lipid panel  3. Vitamin D deficiency - Vit D  25 hydroxy (rtn osteoporosis monitoring)  4. Arthritis Continue mobic with food PRN  5. Insomnia Insomnia- good sleep hygiene discussed, increase day time activity, try melatonin or benadryl if this does not help we will call in sleep medication.  - ALPRAZolam (XANAX) 0.5 MG tablet; 1/2-1 pill as needed at night for sleep  Dispense: 30 tablet; Refill: 3  6. Erectile dysfunction, unspecified erectile dysfunction type - Testosterone  7. Medication management - Magnesium  8. Screening for rectal cancer - POC Hemoccult Bld/Stl (3-Cd Home Screen); Future  Discussed med's effects and SE's. Screening labs and tests as requested with regular follow-up as recommended.  HPI Patient presents for a complete physical.   His blood pressure has been controlled at home, today their BP is BP: 124/82 mmHg He does not workout, due to lack of time but is very active at [employer]. He denies chest pain, shortness of breath, dizziness.  He is not on cholesterol medication and denies myalgias. His cholesterol is at goal. The cholesterol last visit was:   Lab Results  Component Value Date   CHOL 194 03/21/2014   HDL 65 03/21/2014   LDLCALC 111* 03/21/2014   TRIG 88 03/21/2014   CHOLHDL 3.0 03/21/2014  He has been working on diet and exercise  for prediabetes,  and denies paresthesia of the feet, polydipsia, polyuria and visual disturbances. Last A1C in the office was (5.9 on 09/01/2013):  Lab Results  Component Value Date   HGBA1C 5.6 03/21/2014  Patient is not on Vitamin D supplement.   Lab Results  Component Value Date   VD25OH 43 03/21/2014  He has ED, not on medications.  He has insomnia and takes xanax PRN for this.  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  Works two jobs, [employer] in the evening Monday- friday and [employer] in [place] Monday-Friday. Sleeps 4-5 hours at night. Discussed possibility of sleep study in the past but never did sleep study.   Current Medications:  Current Outpatient Prescriptions on File Prior to Visit  Medication Sig Dispense Refill  . ALPRAZolam (XANAX) 0.5 MG tablet 1/2-1 pill as needed at night for sleep 30 tablet 1  . meloxicam (MOBIC) 15 MG tablet TAKE 1 TABLET BY MOUTH EVERY DAY AS NEEDED FOR PAIN WITH FOOD 90 tablet 0   No current facility-administered medications on file prior to visit.   Health Maintenance:  Immunization History  Administered Date(s) Administered  . Tdap 07/05/2009   Tetanus: 2010  Pneumovax: N/A Flu vaccine: N/A Zostavax: N/A DEXA: N/A Colonoscopy: N/A EGD: N/A Eye exam: None Dentist: [facility]- q 6 months CXR 08/2013  Patient Care Team: Unk Pinto, MD as PCP - General (Internal Medicine)  Allergies: No Known Allergies Medical History:  Past Medical History  Diagnosis Date  . Prediabetes   . Vitamin D deficiency   . Arthritis   . Hyperlipidemia  Surgical History:  Past Surgical History  Procedure Laterality Date  . Orbital fracture surgery Left    Family History:  Family History  Problem Relation Age of Onset  . Arthritis Mother    Social History:   History  Substance Use Topics  . Smoking status: Never Smoker   . Smokeless tobacco: Never Used  . Alcohol Use: 0.0 oz/week    0  Not specified per week     Comment: weekends-2-3 beers   Review of Systems:  Review of Systems  Constitutional: Positive for malaise/fatigue. Negative for fever, chills, weight loss and diaphoresis.  HENT: Negative.   Eyes: Negative.   Respiratory: Negative.  Negative for shortness of breath.   Cardiovascular: Negative.  Negative for chest pain.  Gastrointestinal: Negative.   Genitourinary: Negative.        + ED  Musculoskeletal: Positive for joint pain. Negative for myalgias, back pain, falls and neck pain.  Skin: Negative.   Neurological: Negative.  Negative for weakness.  Psychiatric/Behavioral: Negative for depression, suicidal ideas, hallucinations, memory loss and substance abuse. The patient has insomnia. The patient is not nervous/anxious.    Physical Exam: Estimated body mass index is 25.05 kg/(m^2) as calculated from the following:   Height as of this encounter: 5\' 10"  (1.778 m).   Weight as of this encounter: 174 lb 9.6 oz (79.198 kg). BP 124/82 mmHg  Pulse 76  Temp(Src) 98.2 F (36.8 C)  Resp 16  Ht 5\' 10"  (1.778 m)  Wt 174 lb 9.6 oz (79.198 kg)  BMI 25.05 kg/m2 General Appearance: Well nourished, in no apparent distress.  Eyes: PERRLA, EOMs, conjunctiva no swelling or erythema, normal fundi and vessels.  Sinuses: No Frontal/maxillary tenderness  ENT/Mouth: Ext aud canals clear, normal light reflex with TMs without erythema, bulging. Good dentition. No erythema, swelling, or exudate on post pharynx. Tonsils not swollen or erythematous. Hearing normal.  Neck: Supple, thyroid normal. No bruits  Respiratory: Respiratory effort normal, BS equal bilaterally without rales, rhonchi, wheezing or stridor.  Cardio: RRR without murmurs, rubs or gallops. Brisk peripheral pulses without edema.  Chest: symmetric, with normal excursions and percussion.  Abdomen: Soft, nontender, no guarding, rebound, hernias, masses, or organomegaly. .  Lymphatics: Non tender without  lymphadenopathy.  Genitourinary: defer Musculoskeletal: Full ROM all peripheral extremities,5/5 strength, and normal gait.  Skin: Warm, dry without rashes, lesions, ecchymosis. Neuro: Cranial nerves intact, reflexes equal bilaterally. Normal muscle tone, no cerebellar symptoms. Sensation intact.  Psych: Awake and oriented X 3, normal affect, Insight and Judgment appropriate.   EKG: WNL no changes. AORTA SCAN: defer  Vicie Mutters 2:41 PM [facility]

## 2014-09-06 LAB — BASIC METABOLIC PANEL WITH GFR
BUN: 15 mg/dL (ref 6–23)
CO2: 27 mEq/L (ref 19–32)
Calcium: 9.8 mg/dL (ref 8.4–10.5)
Chloride: 105 mEq/L (ref 96–112)
Creat: 1.05 mg/dL (ref 0.50–1.35)
GFR, Est Non African American: 84 mL/min
Glucose, Bld: 92 mg/dL (ref 70–99)
Potassium: 4.5 mEq/L (ref 3.5–5.3)
Sodium: 141 mEq/L (ref 135–145)

## 2014-09-06 LAB — HEPATIC FUNCTION PANEL
ALBUMIN: 4.7 g/dL (ref 3.5–5.2)
ALK PHOS: 41 U/L (ref 39–117)
ALT: 14 U/L (ref 0–53)
AST: 18 U/L (ref 0–37)
BILIRUBIN INDIRECT: 0.3 mg/dL (ref 0.2–1.2)
Bilirubin, Direct: 0.1 mg/dL (ref 0.0–0.3)
Total Bilirubin: 0.4 mg/dL (ref 0.2–1.2)
Total Protein: 7.2 g/dL (ref 6.0–8.3)

## 2014-09-06 LAB — LIPID PANEL
CHOL/HDL RATIO: 3.2 ratio
CHOLESTEROL: 210 mg/dL — AB (ref 0–200)
HDL: 66 mg/dL (ref >39)
LDL CALC: 128 mg/dL — AB (ref 0–99)
TRIGLYCERIDES: 82 mg/dL (ref <150)
VLDL: 16 mg/dL (ref 0–40)

## 2014-09-06 LAB — VITAMIN D 25 HYDROXY (VIT D DEFICIENCY, FRACTURES): Vit D, 25-Hydroxy: 27 ng/mL — ABNORMAL LOW (ref 30–100)

## 2014-09-06 LAB — URINALYSIS, ROUTINE W REFLEX MICROSCOPIC
BILIRUBIN URINE: NEGATIVE
Glucose, UA: NEGATIVE mg/dL
HGB URINE DIPSTICK: NEGATIVE
KETONES UR: NEGATIVE mg/dL
LEUKOCYTES UA: NEGATIVE
NITRITE: NEGATIVE
PROTEIN: NEGATIVE mg/dL
SPECIFIC GRAVITY, URINE: 1.018 (ref 1.005–1.030)
UROBILINOGEN UA: 0.2 mg/dL (ref 0.0–1.0)
pH: 5.5 (ref 5.0–8.0)

## 2014-09-06 LAB — INSULIN, FASTING: Insulin fasting, serum: 2.9 u[IU]/mL (ref 2.0–19.6)

## 2014-09-06 LAB — TESTOSTERONE: TESTOSTERONE: 297 ng/dL — AB (ref 300–890)

## 2014-09-06 LAB — TSH: TSH: 0.637 u[IU]/mL (ref 0.350–4.500)

## 2014-09-06 LAB — MICROALBUMIN / CREATININE URINE RATIO
Creatinine, Urine: 194.3 mg/dL
MICROALB/CREAT RATIO: 9.3 mg/g (ref 0.0–30.0)
Microalb, Ur: 1.8 mg/dL (ref <2.0)

## 2014-09-06 LAB — MAGNESIUM: MAGNESIUM: 2.2 mg/dL (ref 1.5–2.5)

## 2015-08-17 ENCOUNTER — Ambulatory Visit (INDEPENDENT_AMBULATORY_CARE_PROVIDER_SITE_OTHER): Payer: BLUE CROSS/BLUE SHIELD | Admitting: Internal Medicine

## 2015-08-17 ENCOUNTER — Encounter: Payer: Self-pay | Admitting: Internal Medicine

## 2015-08-17 VITALS — BP 126/88 | HR 90 | Temp 98.2°F | Resp 16 | Ht 70.0 in | Wt 175.0 lb

## 2015-08-17 DIAGNOSIS — M79641 Pain in right hand: Secondary | ICD-10-CM | POA: Diagnosis not present

## 2015-08-17 DIAGNOSIS — M79642 Pain in left hand: Secondary | ICD-10-CM | POA: Diagnosis not present

## 2015-08-17 DIAGNOSIS — G4700 Insomnia, unspecified: Secondary | ICD-10-CM | POA: Diagnosis not present

## 2015-08-17 MED ORDER — MELOXICAM 15 MG PO TABS: ORAL_TABLET | ORAL | Status: DC

## 2015-08-17 MED ORDER — ALPRAZOLAM 0.5 MG PO TABS: ORAL_TABLET | ORAL | Status: DC

## 2015-08-17 MED ORDER — PREDNISONE 20 MG PO TABS: ORAL_TABLET | ORAL | Status: DC

## 2015-08-17 NOTE — Patient Instructions (Signed)

## 2015-08-17 NOTE — Progress Notes (Signed)
   Subjective:    Patient ID: Kyle Lara, male    DOB: 03/03/1966, 51 y.o.   MRN: [id number]  Wrist Pain  Pertinent negatives include no numbness.   Patient presents to the office for evaluation of bilateral hand pain x several years.  He reports that he has sharp stabbing pain that is intermittent depending on what activity he does.  He reports that his hands are weak and he can barely hold things anymore.  He is right handed. He has had injections in his hand in the past.  He has seen Hand Surgery in the past.  He saw Dr. Burney Gauze in the past. He reports that it has been years.  He reports that he would like to go back to hand surgery.    Review of Systems  Musculoskeletal: Positive for arthralgias. Negative for joint swelling.  Skin: Negative for color change, pallor, rash and wound.  Neurological: Negative for numbness.       Objective:   Physical Exam  Constitutional: He is oriented to person, place, and time. He appears well-developed and well-nourished. No distress.  HENT:  Head: Normocephalic.  Mouth/Throat: Oropharynx is clear and moist. No oropharyngeal exudate.  Eyes: Conjunctivae are normal. No scleral icterus.  Neck: Normal range of motion. Neck supple. No JVD present. No thyromegaly present.  Cardiovascular: Normal rate, regular rhythm and intact distal pulses.  Exam reveals no gallop and no friction rub.   No murmur heard. Pulmonary/Chest: Effort normal and breath sounds normal. No respiratory distress. He has no wheezes. He has no rales. He exhibits no tenderness.  Lymphadenopathy:    He has no cervical adenopathy.  Neurological: He is alert and oriented to person, place, and time.  Skin: Skin is warm and dry. He is not diaphoretic.  Psychiatric: He has a normal mood and affect. His behavior is normal. Judgment and thought content normal.  Nursing note and vitals reviewed.   Filed Vitals:   08/17/15 1350  BP: 126/88  Pulse: 90  Temp: 98.2 F (36.8 C)  Resp: 16           Assessment & Plan:    1. Bilateral hand pain -exam most consistent with arthritis, not carpal tunnel - Ambulatory referral to Orthopedics - predniSONE (DELTASONE) 20 MG tablet; 3 tabs po daily x 3 days, then 2 tabs x 3 days, then 1.5 tabs x 3 days, then 1 tab x 3 days, then 0.5 tabs x 3 days  Dispense: 27 tablet; Refill: 0  2. Insomnia  - ALPRAZolam (XANAX) 0.5 MG tablet; 1/2-1 pill as needed at night for sleep  Dispense: 30 tablet; Refill: 3

## 2015-08-29 ENCOUNTER — Other Ambulatory Visit: Payer: Self-pay | Admitting: Physician Assistant

## 2015-09-01 DIAGNOSIS — M79643 Pain in unspecified hand: Secondary | ICD-10-CM | POA: Insufficient documentation

## 2015-09-04 ENCOUNTER — Other Ambulatory Visit: Payer: Self-pay | Admitting: Orthopedic Surgery

## 2015-09-04 DIAGNOSIS — M79641 Pain in right hand: Secondary | ICD-10-CM

## 2015-09-04 DIAGNOSIS — M79642 Pain in left hand: Principal | ICD-10-CM

## 2015-09-13 ENCOUNTER — Ambulatory Visit (INDEPENDENT_AMBULATORY_CARE_PROVIDER_SITE_OTHER): Payer: BLUE CROSS/BLUE SHIELD | Admitting: Physician Assistant

## 2015-09-13 ENCOUNTER — Encounter: Payer: Self-pay | Admitting: Physician Assistant

## 2015-09-13 VITALS — BP 138/70 | HR 82 | Temp 98.1°F | Resp 16 | Ht 70.0 in | Wt 176.0 lb

## 2015-09-13 DIAGNOSIS — R0989 Other specified symptoms and signs involving the circulatory and respiratory systems: Secondary | ICD-10-CM

## 2015-09-13 DIAGNOSIS — I10 Essential (primary) hypertension: Secondary | ICD-10-CM | POA: Diagnosis not present

## 2015-09-13 DIAGNOSIS — Z0000 Encounter for general adult medical examination without abnormal findings: Secondary | ICD-10-CM

## 2015-09-13 DIAGNOSIS — G4700 Insomnia, unspecified: Secondary | ICD-10-CM

## 2015-09-13 DIAGNOSIS — N529 Male erectile dysfunction, unspecified: Secondary | ICD-10-CM

## 2015-09-13 DIAGNOSIS — M1990 Unspecified osteoarthritis, unspecified site: Secondary | ICD-10-CM

## 2015-09-13 DIAGNOSIS — Z1212 Encounter for screening for malignant neoplasm of rectum: Secondary | ICD-10-CM

## 2015-09-13 DIAGNOSIS — E785 Hyperlipidemia, unspecified: Secondary | ICD-10-CM

## 2015-09-13 DIAGNOSIS — E559 Vitamin D deficiency, unspecified: Secondary | ICD-10-CM | POA: Diagnosis not present

## 2015-09-13 DIAGNOSIS — Z79899 Other long term (current) drug therapy: Secondary | ICD-10-CM

## 2015-09-13 DIAGNOSIS — R5383 Other fatigue: Secondary | ICD-10-CM

## 2015-09-13 DIAGNOSIS — R7303 Prediabetes: Secondary | ICD-10-CM

## 2015-09-13 DIAGNOSIS — M79643 Pain in unspecified hand: Secondary | ICD-10-CM

## 2015-09-13 DIAGNOSIS — Z0001 Encounter for general adult medical examination with abnormal findings: Secondary | ICD-10-CM

## 2015-09-13 DIAGNOSIS — Z125 Encounter for screening for malignant neoplasm of prostate: Secondary | ICD-10-CM | POA: Diagnosis not present

## 2015-09-13 LAB — CBC WITH DIFFERENTIAL/PLATELET
Basophils Absolute: 0.1 10*3/uL (ref 0.0–0.1)
Basophils Relative: 2 % — ABNORMAL HIGH (ref 0–1)
Eosinophils Absolute: 0.2 10*3/uL (ref 0.0–0.7)
Eosinophils Relative: 6 % — ABNORMAL HIGH (ref 0–5)
HCT: 39.4 % (ref 39.0–52.0)
Hemoglobin: 13.7 g/dL (ref 13.0–17.0)
Lymphocytes Relative: 28 % (ref 12–46)
Lymphs Abs: 1.1 10*3/uL (ref 0.7–4.0)
MCH: 31.4 pg (ref 26.0–34.0)
MCHC: 34.8 g/dL (ref 30.0–36.0)
MCV: 90.2 fL (ref 78.0–100.0)
MONO ABS: 0.3 10*3/uL (ref 0.1–1.0)
MONOS PCT: 7 % (ref 3–12)
MPV: 10.4 fL (ref 8.6–12.4)
Neutro Abs: 2.3 10*3/uL (ref 1.7–7.7)
Neutrophils Relative %: 57 % (ref 43–77)
Platelets: 200 10*3/uL (ref 150–400)
RBC: 4.37 MIL/uL (ref 4.22–5.81)
RDW: 13.5 % (ref 11.5–15.5)
WBC: 4 10*3/uL (ref 4.0–10.5)

## 2015-09-13 LAB — URINALYSIS, ROUTINE W REFLEX MICROSCOPIC
BILIRUBIN URINE: NEGATIVE
Glucose, UA: NEGATIVE
HGB URINE DIPSTICK: NEGATIVE
Ketones, ur: NEGATIVE
Leukocytes, UA: NEGATIVE
NITRITE: NEGATIVE
PROTEIN: NEGATIVE
Specific Gravity, Urine: 1.026 (ref 1.001–1.035)
pH: 7.5 (ref 5.0–8.0)

## 2015-09-13 LAB — HEMOGLOBIN A1C
Hgb A1c MFr Bld: 6 % — ABNORMAL HIGH (ref <5.7)
Mean Plasma Glucose: 126 mg/dL — ABNORMAL HIGH (ref <117)
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
## 2015-09-13 NOTE — Patient Instructions (Signed)

## 2015-09-14 LAB — HEPATIC FUNCTION PANEL
ALT: 16 U/L (ref 9–46)
AST: 22 U/L (ref 10–40)
Albumin: 4 g/dL (ref 3.6–5.1)
Alkaline Phosphatase: 32 U/L — ABNORMAL LOW (ref 40–115)
BILIRUBIN DIRECT: 0.1 mg/dL (ref <=0.2)
BILIRUBIN INDIRECT: 0.4 mg/dL (ref 0.2–1.2)
TOTAL PROTEIN: 6.4 g/dL (ref 6.1–8.1)
Total Bilirubin: 0.5 mg/dL (ref 0.2–1.2)

## 2015-09-14 LAB — BASIC METABOLIC PANEL WITH GFR
BUN: 17 mg/dL (ref 7–25)
CO2: 28 mmol/L (ref 20–31)
CREATININE: 0.92 mg/dL (ref 0.60–1.35)
Calcium: 9.2 mg/dL (ref 8.6–10.3)
Chloride: 104 mmol/L (ref 98–110)
GFR, Est Non African American: >89 mL/min (ref >=60)
Glucose, Bld: 92 mg/dL (ref 65–99)
Potassium: 4.4 mmol/L (ref 3.5–5.3)
SODIUM: 139 mmol/L (ref 135–146)

## 2015-09-14 LAB — LIPID PANEL
Cholesterol: 192 mg/dL (ref 125–200)
HDL: 62 mg/dL (ref >=40)
LDL CALC: 120 mg/dL (ref <130)
TRIGLYCERIDES: 49 mg/dL (ref <150)
Total CHOL/HDL Ratio: 3.1 Ratio (ref <=5.0)
VLDL: 10 mg/dL (ref <30)

## 2015-09-14 LAB — MICROALBUMIN / CREATININE URINE RATIO
CREATININE, URINE: 199 mg/dL (ref 20–370)
MICROALB UR: 0.3 mg/dL
Microalb Creat Ratio: 2 mcg/mg creat (ref <30)

## 2015-09-14 LAB — IRON AND TIBC
%SAT: 36 % (ref 15–60)
Iron: 106 ug/dL (ref 50–180)
TIBC: 294 ug/dL (ref 250–425)
UIBC: 188 ug/dL (ref 125–400)

## 2015-09-14 LAB — PSA: PSA: 0.57 ng/mL (ref <=4.00)

## 2015-09-14 LAB — MAGNESIUM: MAGNESIUM: 2 mg/dL (ref 1.5–2.5)

## 2015-09-14 LAB — TSH: TSH: 1.246 u[IU]/mL (ref 0.350–4.500)

## 2015-09-14 LAB — FERRITIN: Ferritin: 173 ng/mL (ref 22–322)

## 2015-09-14 LAB — TESTOSTERONE: TESTOSTERONE: 397 ng/dL (ref 250–827)

## 2015-09-14 LAB — INSULIN, FASTING: Insulin fasting, serum: 2.8 u[IU]/mL (ref 2.0–19.6)

## 2015-09-14 LAB — VITAMIN D 25 HYDROXY (VIT D DEFICIENCY, FRACTURES): Vit D, 25-Hydroxy: 27 ng/mL — ABNORMAL LOW (ref 30–100)

## 2015-09-19 ENCOUNTER — Other Ambulatory Visit: Payer: Self-pay

## 2015-10-23 ENCOUNTER — Other Ambulatory Visit: Payer: Self-pay | Admitting: Physician Assistant

## 2015-10-25 ENCOUNTER — Encounter: Payer: Self-pay | Admitting: Internal Medicine

## 2015-10-25 ENCOUNTER — Ambulatory Visit (INDEPENDENT_AMBULATORY_CARE_PROVIDER_SITE_OTHER): Payer: BLUE CROSS/BLUE SHIELD | Admitting: Internal Medicine

## 2015-10-25 VITALS — BP 130/84 | HR 86 | Temp 98.2°F | Resp 18 | Ht 70.0 in | Wt 175.0 lb

## 2015-10-25 DIAGNOSIS — J069 Acute upper respiratory infection, unspecified: Secondary | ICD-10-CM

## 2015-10-25 MED ORDER — CETIRIZINE HCL 10 MG PO TABS: 10.0000 mg | ORAL_TABLET | Freq: Every day | ORAL | Status: DC

## 2015-10-25 MED ORDER — PREDNISONE 20 MG PO TABS: ORAL_TABLET | ORAL | Status: DC

## 2015-10-25 MED ORDER — BENZONATATE 200 MG PO CAPS: 200.0000 mg | ORAL_CAPSULE | Freq: Three times a day (TID) | ORAL | Status: DC | PRN

## 2015-10-25 MED ORDER — FLUTICASONE PROPIONATE 50 MCG/ACT NA SUSP: 2.0000 | Freq: Every day | NASAL | Status: DC

## 2015-10-25 NOTE — Progress Notes (Signed)
HPI  Patient presents to the office for evaluation of cough.  It has been going on for 1 days.  Patient reports night > day, dry, barky, worse with lying down.  They also endorse change in voice, postnasal drip and nasal congestion, rhinorrhea, sore throat..  They have tried alka seltzer plus and nasal decongestant.  They report that nothing has worked.  They admits to other sick contacts.  Review of Systems  Constitutional: Positive for malaise/fatigue. Negative for fever and chills.  HENT: Positive for congestion, ear pain and sore throat. Negative for nosebleeds.   Respiratory: Positive for cough. Negative for sputum production, shortness of breath and wheezing.   Cardiovascular: Negative for chest pain, palpitations and leg swelling.  Neurological: Positive for headaches.    PE:  Filed Vitals:   10/25/15 1522  BP: 130/84  Pulse: 86  Temp: 98.2 F (36.8 C)  Resp: 18    General:  Alert and non-toxic, WDWN, NAD HEENT: NCAT, PERLA, EOM normal, no occular discharge or erythema.  Nasal mucosal edema with sinus tenderness to palpation.  Oropharynx clear with minimal oropharyngeal edema and erythema.  Mucous membranes moist and pink. Neck:  Cervical adenopathy Chest:  RRR no MRGs.  Lungs clear to auscultation A&P with no wheezes rhonchi or rales.   Abdomen: +BS x 4 quadrants, soft, non-tender, no guarding, rigidity, or rebound. Skin: warm and dry no rash Neuro: A&Ox4, CN II-XII grossly intact  Assessment and Plan:   1. Acute URI -nasal saline -benadryl -zyrtec -prednisone -flonase

## 2015-10-25 NOTE — Patient Instructions (Addendum)
Please use saline in your nose as often as you can.  Please take tessalon perles 3 times a day for coughing  Please use flonase 2 sprays per nostril right before bedtime.  Please take zyrtec daily.  Please take the prednisone until it is gone.  Take 50 mg of benadryl before bedtime.

## 2015-11-13 ENCOUNTER — Ambulatory Visit: Payer: Self-pay | Admitting: Physician Assistant

## 2015-11-17 ENCOUNTER — Other Ambulatory Visit: Payer: Self-pay

## 2015-11-23 ENCOUNTER — Other Ambulatory Visit: Payer: Self-pay | Admitting: Internal Medicine

## 2016-03-25 ENCOUNTER — Ambulatory Visit (INDEPENDENT_AMBULATORY_CARE_PROVIDER_SITE_OTHER): Payer: BLUE CROSS/BLUE SHIELD | Admitting: Physician Assistant

## 2016-03-25 ENCOUNTER — Encounter: Payer: Self-pay | Admitting: Physician Assistant

## 2016-03-25 VITALS — BP 120/90 | HR 82 | Temp 97.7°F | Resp 14 | Ht 70.0 in | Wt 170.0 lb

## 2016-03-25 DIAGNOSIS — E559 Vitamin D deficiency, unspecified: Secondary | ICD-10-CM

## 2016-03-25 DIAGNOSIS — Z79899 Other long term (current) drug therapy: Secondary | ICD-10-CM

## 2016-03-25 DIAGNOSIS — I10 Essential (primary) hypertension: Secondary | ICD-10-CM | POA: Diagnosis not present

## 2016-03-25 DIAGNOSIS — E785 Hyperlipidemia, unspecified: Secondary | ICD-10-CM

## 2016-03-25 DIAGNOSIS — R7303 Prediabetes: Secondary | ICD-10-CM

## 2016-03-25 DIAGNOSIS — M79643 Pain in unspecified hand: Secondary | ICD-10-CM | POA: Diagnosis not present

## 2016-03-25 DIAGNOSIS — R0989 Other specified symptoms and signs involving the circulatory and respiratory systems: Secondary | ICD-10-CM

## 2016-03-25 LAB — LIPID PANEL
CHOL/HDL RATIO: 2.5 ratio (ref <=5.0)
CHOLESTEROL: 177 mg/dL (ref 125–200)
HDL: 71 mg/dL (ref >=40)
LDL Cholesterol: 81 mg/dL (ref <130)
Triglycerides: 126 mg/dL (ref <150)
VLDL: 25 mg/dL (ref <30)

## 2016-03-25 LAB — CBC WITH DIFFERENTIAL/PLATELET
BASOS PCT: 2 %
Basophils Absolute: 98 cells/uL (ref 0–200)
EOS ABS: 245 {cells}/uL (ref 15–500)
Eosinophils Relative: 5 %
HCT: 39.3 % (ref 38.5–50.0)
HEMOGLOBIN: 13.2 g/dL (ref 13.2–17.1)
Lymphocytes Relative: 26 %
Lymphs Abs: 1274 cells/uL (ref 850–3900)
MCH: 30.1 pg (ref 27.0–33.0)
MCHC: 33.6 g/dL (ref 32.0–36.0)
MCV: 89.7 fL (ref 80.0–100.0)
MONO ABS: 490 {cells}/uL (ref 200–950)
MONOS PCT: 10 %
MPV: 10.7 fL (ref 7.5–12.5)
NEUTROS ABS: 2793 {cells}/uL (ref 1500–7800)
Neutrophils Relative %: 57 %
Platelets: 253 10*3/uL (ref 140–400)
RBC: 4.38 MIL/uL (ref 4.20–5.80)
RDW: 13.1 % (ref 11.0–15.0)
WBC: 4.9 10*3/uL (ref 3.8–10.8)

## 2016-03-25 LAB — BASIC METABOLIC PANEL WITH GFR
BUN: 21 mg/dL (ref 7–25)
CALCIUM: 9.1 mg/dL (ref 8.6–10.3)
CHLORIDE: 104 mmol/L (ref 98–110)
CO2: 26 mmol/L (ref 20–31)
CREATININE: 1.36 mg/dL — AB (ref 0.70–1.33)
GFR, Est African American: 70 mL/min (ref >=60)
GFR, Est Non African American: 60 mL/min (ref >=60)
GLUCOSE: 94 mg/dL (ref 65–99)
Potassium: 4.2 mmol/L (ref 3.5–5.3)
SODIUM: 137 mmol/L (ref 135–146)

## 2016-03-25 LAB — HEPATIC FUNCTION PANEL
ALBUMIN: 4.3 g/dL (ref 3.6–5.1)
ALT: 11 U/L (ref 9–46)
AST: 15 U/L (ref 10–35)
Alkaline Phosphatase: 42 U/L (ref 40–115)
BILIRUBIN DIRECT: 0.1 mg/dL (ref <=0.2)
Indirect Bilirubin: 0.3 mg/dL (ref 0.2–1.2)
TOTAL PROTEIN: 6.6 g/dL (ref 6.1–8.1)
Total Bilirubin: 0.4 mg/dL (ref 0.2–1.2)

## 2016-03-25 LAB — MAGNESIUM: MAGNESIUM: 2.1 mg/dL (ref 1.5–2.5)

## 2016-03-25 MED ORDER — MELOXICAM 15 MG PO TABS: ORAL_TABLET | ORAL | 1 refills | Status: DC

## 2016-03-25 MED ORDER — TERBINAFINE HCL 250 MG PO TABS: 250.0000 mg | ORAL_TABLET | Freq: Every day | ORAL | 0 refills | Status: AC

## 2016-03-25 MED ORDER — PREDNISONE 20 MG PO TABS: ORAL_TABLET | ORAL | 0 refills | Status: DC
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
## 2016-03-25 NOTE — Patient Instructions (Signed)
Follow back up with ortho Do not take mobic with the prednisone, can restart when done.   Okay I will send in lamisil for you to take. You can only get it from [facility] or [facility] because insurance will not cover it.The lamisil is taken once a day for  months and then if can be taken for several months after for a month on it and month off it. It gets processed through you liver so we need to check your liver function at 6 weeks after taking the drug. It can take up to 6 months to a year for your toenails to get better.   Corns and Calluses Corns are small areas of thickened skin that occur on the top, sides, or tip of a toe. They contain a cone-shaped core with a point that can press on a nerve below. This causes pain. Calluses are areas of thickened skin that can occur anywhere on the body including hands, fingers, palms, soles of the feet, and heels.Calluses are usually larger than corns.  CAUSES  Corns and calluses are caused by rubbing (friction) or pressure, such as from shoes that are too tight or do not fit properly.  RISK FACTORS Corns are more likely to develop in people who have toe deformities, such as hammer toes. Since calluses can occur with friction to any area of the skin, calluses are more likely to develop in people who:   Work with their hands.  Wear shoes that fit poorly, shoes that are too tight, or shoes that are high-heeled.  Have toes deformities. SYMPTOMS Symptoms of a corn or callus include:  A hard growth on the skin.   Pain or tenderness under the skin.   Redness and swelling.   Increased discomfort while wearing tight-fitting shoes. DIAGNOSIS  Corns and calluses may be diagnosed with a medical history and physical exam.  TREATMENT  Corns and calluses may be treated with:  Removing the cause of the friction or pressure. This may include:  Changing your shoes.  Wearing shoe inserts (orthotics) or other protective layers in your shoes, such as  a corn pad.  Wearing gloves.  Medicines to help soften skin in the hardened, thickened areas.  Reducing the size of the corn or callus by removing the dead layers of skin.  Antibiotic medicines to treat infection.  Surgery, if a toe deformity is the cause. HOME CARE INSTRUCTIONS   Take medicines only as directed by your health care provider.  If you were prescribed an antibiotic, finish all of it even if you start to feel better.  Wear shoes that fit well. Avoid wearing high-heeled shoes and shoes that are too tight or too loose.  Wear any padding, protective layers, gloves, or orthotics as directed by your health care provider.  Soak your hands or feet and then use a file or pumice stone to soften your corn or callus. Do this as directed by your health care provider.  Check your corn or callus every day for signs of infection. Watch for:  Redness, swelling, or pain.  Fluid, blood, or pus. SEEK MEDICAL CARE IF:   Your symptoms do not improve with treatment.  You have increased redness, swelling, or pain at the site of your corn or callus.  You have fluid, blood, or pus coming from your corn or callus.  You have new symptoms.   This information is not intended to replace advice given to you by your health care provider. Make sure you discuss  any questions you have with your health care provider.   Document Released: 05/04/2004 Document Revised: 12/13/2014 Document Reviewed: 07/25/2014 Elsevier Interactive Patient Education [employer].

## 2016-03-26 ENCOUNTER — Other Ambulatory Visit: Payer: Self-pay | Admitting: Physician Assistant

## 2016-03-26 DIAGNOSIS — Z79899 Other long term (current) drug therapy: Secondary | ICD-10-CM

## 2016-03-26 LAB — TSH: TSH: 1.68 m[IU]/L (ref 0.40–4.50)

## 2016-03-27 LAB — HEMOGLOBIN A1C
HEMOGLOBIN A1C: 5.5 % (ref <5.7)
MEAN PLASMA GLUCOSE: 111 mg/dL

## 2016-05-06 ENCOUNTER — Other Ambulatory Visit: Payer: BLUE CROSS/BLUE SHIELD

## 2016-05-06 DIAGNOSIS — Z79899 Other long term (current) drug therapy: Secondary | ICD-10-CM

## 2016-05-06 DIAGNOSIS — I10 Essential (primary) hypertension: Secondary | ICD-10-CM | POA: Diagnosis not present

## 2016-05-06 LAB — BASIC METABOLIC PANEL WITH GFR
BUN: 13 mg/dL (ref 7–25)
CHLORIDE: 104 mmol/L (ref 98–110)
CO2: 23 mmol/L (ref 20–31)
CREATININE: 1.12 mg/dL (ref 0.70–1.33)
Calcium: 9.3 mg/dL (ref 8.6–10.3)
GFR, EST NON AFRICAN AMERICAN: 76 mL/min (ref >=60)
GFR, Est African American: 88 mL/min (ref >=60)
GLUCOSE: 93 mg/dL (ref 65–99)
Potassium: 4 mmol/L (ref 3.5–5.3)
Sodium: 137 mmol/L (ref 135–146)

## 2016-05-06 LAB — HEPATIC FUNCTION PANEL
ALT: 14 U/L (ref 9–46)
AST: 21 U/L (ref 10–35)
Albumin: 4.3 g/dL (ref 3.6–5.1)
Alkaline Phosphatase: 35 U/L — ABNORMAL LOW (ref 40–115)
BILIRUBIN DIRECT: 0.1 mg/dL (ref <=0.2)
BILIRUBIN INDIRECT: 0.4 mg/dL (ref 0.2–1.2)
BILIRUBIN TOTAL: 0.5 mg/dL (ref 0.2–1.2)
Total Protein: 6.4 g/dL (ref 6.1–8.1)

## 2016-08-25 ENCOUNTER — Emergency Department (HOSPITAL_COMMUNITY): Payer: BLUE CROSS/BLUE SHIELD

## 2016-08-25 ENCOUNTER — Emergency Department (HOSPITAL_COMMUNITY)
Admission: EM | Admit: 2016-08-25 | Discharge: 2016-08-25 | Disposition: A | Discharge status: Home / Self Care | Payer: BLUE CROSS/BLUE SHIELD | Attending: Emergency Medicine | Admitting: Emergency Medicine

## 2016-08-25 ENCOUNTER — Encounter (HOSPITAL_COMMUNITY): Payer: Self-pay | Admitting: Emergency Medicine

## 2016-08-25 DIAGNOSIS — Z7289 Other problems related to lifestyle: Secondary | ICD-10-CM

## 2016-08-25 DIAGNOSIS — S93401A Sprain of unspecified ligament of right ankle, initial encounter: Secondary | ICD-10-CM | POA: Diagnosis not present

## 2016-08-25 DIAGNOSIS — Z79899 Other long term (current) drug therapy: Secondary | ICD-10-CM | POA: Diagnosis not present

## 2016-08-25 DIAGNOSIS — Y92410 Unspecified street and highway as the place of occurrence of the external cause: Secondary | ICD-10-CM | POA: Insufficient documentation

## 2016-08-25 DIAGNOSIS — F1010 Alcohol abuse, uncomplicated: Secondary | ICD-10-CM | POA: Diagnosis not present

## 2016-08-25 DIAGNOSIS — Y999 Unspecified external cause status: Secondary | ICD-10-CM | POA: Diagnosis not present

## 2016-08-25 DIAGNOSIS — Z789 Other specified health status: Secondary | ICD-10-CM

## 2016-08-25 DIAGNOSIS — S99911A Unspecified injury of right ankle, initial encounter: Secondary | ICD-10-CM | POA: Diagnosis present

## 2016-08-25 DIAGNOSIS — R930 Abnormal findings on diagnostic imaging of skull and head, not elsewhere classified: Secondary | ICD-10-CM | POA: Insufficient documentation

## 2016-08-25 DIAGNOSIS — Y939 Activity, unspecified: Secondary | ICD-10-CM | POA: Insufficient documentation

## 2016-08-25 NOTE — ED Notes (Signed)
Pt returned from XR. Pt on monitors 
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
## 2016-08-25 NOTE — ED Notes (Signed)
Pt to CT

## 2016-08-25 NOTE — ED Notes (Signed)
To x-ray

## 2016-08-27 ENCOUNTER — Ambulatory Visit (INDEPENDENT_AMBULATORY_CARE_PROVIDER_SITE_OTHER): Payer: BLUE CROSS/BLUE SHIELD | Admitting: Internal Medicine

## 2016-08-27 ENCOUNTER — Encounter: Payer: Self-pay | Admitting: Internal Medicine

## 2016-08-27 DIAGNOSIS — S93401A Sprain of unspecified ligament of right ankle, initial encounter: Secondary | ICD-10-CM | POA: Diagnosis not present
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
## 2016-08-27 NOTE — Patient Instructions (Signed)
Ankle Sprain, Phase I Rehab  Ask your health care provider which exercises are safe for you. Do exercises exactly as told by your health care provider and adjust them as directed. It is normal to feel mild stretching, pulling, tightness, or discomfort as you do these exercises, but you should stop right away if you feel sudden pain or your pain gets worse. Do not begin these exercises until told by your health care provider.  Stretching and range of motion exercises  These exercises warm up your muscles and joints and improve the movement and flexibility of your lower leg and ankle. These exercises also help to relieve pain and stiffness.  Exercise A: Gastroc and soleus stretch     1. Sit on the floor with your left / right leg extended.  2. Loop a belt or towel around the ball of your left / right foot. The ball of your foot is on the walking surface, right under your toes.  3. Keep your left / right ankle and foot relaxed and keep your knee straight while you use the belt or towel to pull your foot toward you. You should feel a gentle stretch behind your calf or knee.  4. Hold this position for __________ seconds, then release to the starting position.  Repeat the exercise with your knee bent. You can put a pillow or a rolled bath towel under your knee to support it. You should feel a stretch deep in your calf or at your Achilles tendon.  Repeat each stretch __________ times. Complete these stretches __________ times a day.  Exercise B: Ankle alphabet     1. Sit with your left / right leg supported at the lower leg.  ? Do not rest your foot on anything.  ? Make sure your foot has room to move freely.  2. Think of your left / right foot as a paintbrush, and move your foot to trace each letter of the alphabet in the air. Keep your hip and knee still while you trace. Make the letters as large as you can without feeling discomfort.  3. Trace every letter from A to Z.  Repeat __________ times. Complete this exercise  __________ times a day.  Strengthening exercises  These exercises build strength and endurance in your ankle and lower leg. Endurance is the ability to use your muscles for a long time, even after they get tired.  Exercise C: Dorsiflexors     1. Secure a rubber exercise band or tube to an object, such as a table leg, that will stay still when the band is pulled. Secure the other end around your left / right foot.  2. Sit on the floor facing the object, with your left / right leg extended. The band or tube should be slightly tense when your foot is relaxed.  3. Slowly bring your foot toward you, pulling the band tighter.  4. Hold this position for __________ seconds.  5. Slowly return your foot to the starting position.  Repeat __________ times. Complete this exercise __________ times a day.  Exercise D: Plantar flexors     1. Sit on the floor with your left / right leg extended.  2. Loop a rubber exercise tube or band around the ball of your left / right foot. The ball of your foot is on the walking surface, right under your toes.  ? Hold the ends of the band or tube in your hands.  ? The band or tube should be slightly   tense when your foot is relaxed.  3. Slowly point your foot and toes downward, pushing them away from you.  4. Hold this position for __________ seconds.  5. Slowly return your foot to the starting position.  Repeat __________ times. Complete this exercise __________ times a day.  Exercise E: Evertors   1. Sit on the floor with your legs straight out in front of you.  2. Loop a rubber exercise band or tube around the ball of your left / right foot. The ball of your foot is on the walking surface, right under your toes.  ? Hold the ends of the band in your hands, or secure the band to a stable object.  ? The band or tube should be slightly tense when your foot is relaxed.  3. Slowly push your foot outward, away from your other leg.  4. Hold this position for __________ seconds.  5. Slowly return your  foot to the starting position.  Repeat __________ times. Complete this exercise __________ times a day.  This information is not intended to replace advice given to you by your health care provider. Make sure you discuss any questions you have with your health care provider.  Document Released: 02/27/2005 Document Revised: 04/04/2016 Document Reviewed: 06/12/2015  Elsevier Interactive Patient Education © 2017 [employer]

## 2016-08-30 ENCOUNTER — Other Ambulatory Visit: Payer: Self-pay | Admitting: Internal Medicine

## 2016-09-05 ENCOUNTER — Ambulatory Visit (INDEPENDENT_AMBULATORY_CARE_PROVIDER_SITE_OTHER): Payer: BLUE CROSS/BLUE SHIELD | Admitting: Internal Medicine

## 2016-09-05 VITALS — BP 118/66 | HR 74 | Temp 98.2°F | Resp 18 | Ht 70.0 in

## 2016-09-05 DIAGNOSIS — S93491A Sprain of other ligament of right ankle, initial encounter: Secondary | ICD-10-CM

## 2016-09-06 NOTE — Progress Notes (Signed)
Assessment and Plan:   1. Sprain of anterior talofibular ligament of right ankle, initial encounter -transition to tennis shoe with ASO brace -once in a shoe can return to work. -cont antiinflammatory medications -work note given -no need for further use of crutches.    HPI 51 y.o.male presents for 1 and a half week follow-up for severe ankle sprain.  He is massaging his foot and also using ice.  He is taking his daily antiinflammatory medications.  He is walking in the boot.  He is not using his crutches unless his foot really hurts.  Swelling is down.  He reports that bruising is improved.  He has been working on ROM exercises at home.   Past Medical History:  Diagnosis Date  . Arthritis   . Hyperlipidemia   . Prediabetes   . Vitamin D deficiency      No Known Allergies    Current Outpatient Prescriptions on File Prior to Visit  Medication Sig Dispense Refill  . meloxicam (MOBIC) 15 MG tablet TAKE 1 TABLET BY MOUTH EVERY DAY AS NEEDED FOR PAIN WITH FOOD 90 tablet 1   No current facility-administered medications on file prior to visit.     ROS: all negative except above.   Physical Exam: There were no vitals filed for this visit. BP 118/66   Pulse 74   Temp 98.2 F (36.8 C) (Temporal)   Resp 18   Ht 5\' 10"  (1.778 m)  General Appearance: Well developed well nourished, non-toxic appearing in no apparent distress. Eyes: PERRLA, EOMs, conjunctiva w/ no swelling or erythema or discharge Sinuses: No Frontal/maxillary tenderness ENT/Mouth: Ear canals clear without swelling or erythema.  TM's normal bilaterally with no retractions, bulging, or loss of landmarks.   Neck: Supple, thyroid normal, no notable JVD  Respiratory: Respiratory effort normal, Clear breath sounds anteriorly and posteriorly bilaterally without rales, rhonchi, wheezing or stridor. No retractions or accessory muscle usage. Cardio: RRR with no MRGs.   Abdomen: Soft, + BS.  Non tender, no guarding, rebound,  hernias, masses.  Musculoskeletal: Right ankle with minimal effusion.  Mildly tender to palpation over the CF and ATF.  No deltoid tenderness.  Mildly limted active ROM due to pain and tightness.  Neurovascularly intact.  Skin: Warm, dry without rashes  Neuro: Awake and oriented X 3, Cranial nerves intact. Normal muscle tone, no cerebellar symptoms. Sensation intact.  Psych: normal affect, Insight and Judgment appropriate.     Starlyn Skeans, PA-C 9:39 AM [facility]

## 2016-09-16 ENCOUNTER — Encounter: Payer: Self-pay | Admitting: Physician Assistant

## 2016-09-16 ENCOUNTER — Ambulatory Visit (INDEPENDENT_AMBULATORY_CARE_PROVIDER_SITE_OTHER): Payer: BLUE CROSS/BLUE SHIELD | Admitting: Physician Assistant

## 2016-09-16 VITALS — BP 126/78 | HR 86 | Temp 98.7°F | Resp 16 | Ht 70.0 in | Wt 175.0 lb

## 2016-09-16 DIAGNOSIS — Z125 Encounter for screening for malignant neoplasm of prostate: Secondary | ICD-10-CM

## 2016-09-16 DIAGNOSIS — Z136 Encounter for screening for cardiovascular disorders: Secondary | ICD-10-CM

## 2016-09-16 DIAGNOSIS — Z1389 Encounter for screening for other disorder: Secondary | ICD-10-CM

## 2016-09-16 DIAGNOSIS — I10 Essential (primary) hypertension: Secondary | ICD-10-CM

## 2016-09-16 DIAGNOSIS — E559 Vitamin D deficiency, unspecified: Secondary | ICD-10-CM

## 2016-09-16 DIAGNOSIS — S93491A Sprain of other ligament of right ankle, initial encounter: Secondary | ICD-10-CM

## 2016-09-16 DIAGNOSIS — Z114 Encounter for screening for human immunodeficiency virus [HIV]: Secondary | ICD-10-CM

## 2016-09-16 DIAGNOSIS — N529 Male erectile dysfunction, unspecified: Secondary | ICD-10-CM

## 2016-09-16 DIAGNOSIS — Z79899 Other long term (current) drug therapy: Secondary | ICD-10-CM

## 2016-09-16 DIAGNOSIS — M1990 Unspecified osteoarthritis, unspecified site: Secondary | ICD-10-CM

## 2016-09-16 DIAGNOSIS — G4700 Insomnia, unspecified: Secondary | ICD-10-CM

## 2016-09-16 DIAGNOSIS — M79643 Pain in unspecified hand: Secondary | ICD-10-CM

## 2016-09-16 DIAGNOSIS — Z0001 Encounter for general adult medical examination with abnormal findings: Secondary | ICD-10-CM

## 2016-09-16 DIAGNOSIS — R7303 Prediabetes: Secondary | ICD-10-CM

## 2016-09-16 DIAGNOSIS — Z0000 Encounter for general adult medical examination without abnormal findings: Secondary | ICD-10-CM

## 2016-09-16 DIAGNOSIS — E785 Hyperlipidemia, unspecified: Secondary | ICD-10-CM

## 2016-09-16 LAB — CBC WITH DIFFERENTIAL/PLATELET
Basophils Absolute: 129 cells/uL (ref 0–200)
Basophils Relative: 3 %
EOS ABS: 172 {cells}/uL (ref 15–500)
EOS PCT: 4 %
HCT: 42.3 % (ref 38.5–50.0)
Hemoglobin: 14.2 g/dL (ref 13.2–17.1)
LYMPHS PCT: 33 %
Lymphs Abs: 1419 cells/uL (ref 850–3900)
MCH: 30.5 pg (ref 27.0–33.0)
MCHC: 33.6 g/dL (ref 32.0–36.0)
MCV: 90.8 fL (ref 80.0–100.0)
MONOS PCT: 7 %
MPV: 11.1 fL (ref 7.5–12.5)
Monocytes Absolute: 301 cells/uL (ref 200–950)
NEUTROS ABS: 2279 {cells}/uL (ref 1500–7800)
Neutrophils Relative %: 53 %
PLATELETS: 270 10*3/uL (ref 140–400)
RBC: 4.66 MIL/uL (ref 4.20–5.80)
RDW: 13.3 % (ref 11.0–15.0)
WBC: 4.3 10*3/uL (ref 3.8–10.8)

## 2016-09-16 LAB — BASIC METABOLIC PANEL WITH GFR
BUN: 10 mg/dL (ref 7–25)
CALCIUM: 10.1 mg/dL (ref 8.6–10.3)
CHLORIDE: 102 mmol/L (ref 98–110)
CO2: 30 mmol/L (ref 20–31)
CREATININE: 1.03 mg/dL (ref 0.70–1.33)
GFR, Est Non African American: 84 mL/min (ref >=60)
Glucose, Bld: 95 mg/dL (ref 65–99)
Potassium: 4.4 mmol/L (ref 3.5–5.3)
SODIUM: 139 mmol/L (ref 135–146)

## 2016-09-16 LAB — LIPID PANEL
CHOL/HDL RATIO: 3.4 ratio (ref <5.0)
CHOLESTEROL: 198 mg/dL (ref <200)
HDL: 59 mg/dL (ref >40)
LDL Cholesterol: 114 mg/dL — ABNORMAL HIGH (ref <100)
Triglycerides: 127 mg/dL (ref <150)
VLDL: 25 mg/dL (ref <30)

## 2016-09-16 LAB — HEPATIC FUNCTION PANEL
ALBUMIN: 4.5 g/dL (ref 3.6–5.1)
ALT: 8 U/L — ABNORMAL LOW (ref 9–46)
AST: 12 U/L (ref 10–35)
Alkaline Phosphatase: 37 U/L — ABNORMAL LOW (ref 40–115)
BILIRUBIN TOTAL: 0.6 mg/dL (ref 0.2–1.2)
Bilirubin, Direct: 0.1 mg/dL (ref <=0.2)
Indirect Bilirubin: 0.5 mg/dL (ref 0.2–1.2)
Total Protein: 7 g/dL (ref 6.1–8.1)

## 2016-09-16 LAB — PSA: PSA: 0.5 ng/mL (ref <=4.0)

## 2016-09-16 LAB — TSH: TSH: 1.93 m[IU]/L (ref 0.40–4.50)

## 2016-09-16 LAB — MAGNESIUM: MAGNESIUM: 2 mg/dL (ref 1.5–2.5)

## 2016-09-16 NOTE — Progress Notes (Signed)
Complete Physical  Assessment and Plan:  Prediabetes Discussed general issues about diabetes pathophysiology and management., Educational material distributed., Suggested low cholesterol diet., Encouraged aerobic exercise., Discussed foot care., Reminded to get yearly retinal exam. - CBC with Differential/Platelet - BASIC METABOLIC PANEL WITH GFR - Hepatic function panel - TSH - Hemoglobin A1c - Insulin, fasting - Urinalysis, Routine w reflex microscopic - EKG 12-Lead - Microalbumin / creatinine urine ratio  Hyperlipidemia -continue medications, check lipids, decrease fatty foods, increase activity.  - Lipid panel   Vitamin D deficiency - Vit D  25 hydroxy (rtn osteoporosis monitoring)   Arthritis Continue mobic with food PRN, following ortho  Insomnia Insomnia- good sleep hygiene discussed, increase day time activity, try melatonin or benadryl if this does not help we will call in sleep medication.  - ALPRAZolam (XANAX) 0.5 MG tablet; 1/2-1 pill as needed at night for sleep  Dispense: 30 tablet; Refill: 3  Erectile dysfunction, unspecified erectile dysfunction type - Testosterone   Medication management - Magnesium   Screening for rectal cancer Will need referral to GI, declines hemoccult  Right ankle sprain Will release back to work without restrictions.  Discussed med's effects and SE's. Screening labs and tests as requested with regular follow-up as recommended.  HPI Patient presents for a complete physical.   His blood pressure has been controlled at home, today their BP is BP: 126/78 He does not workout, due to lack of time but is very active at [employer]. He denies chest pain, shortness of breath, dizziness.  He is not on cholesterol medication and denies myalgias. His cholesterol is at goal. The cholesterol last visit was:   Lab Results  Component Value Date   CHOL 177 03/25/2016   HDL 71 03/25/2016   LDLCALC 81 03/25/2016   TRIG 126 03/25/2016   CHOLHDL 2.5  03/25/2016  He has been working on diet and exercise for prediabetes,  and denies paresthesia of the feet, polydipsia, polyuria and visual disturbances. Last A1C in the office was (5.9 on 09/01/2013):  Lab Results  Component Value Date   HGBA1C 5.5 03/25/2016  Patient is on Vitamin D supplement.   Lab Results  Component Value Date   VD25OH 27 (L) 09/13/2015  He has ED, not on medications, testosterone was low last visit He has insomnia and takes xanax PRN for this.  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 Works two jobs, [employer] in the evening Monday- friday and [employer] in [place] Monday-Friday.    Current Medications:  Allergies as of 09/16/2016   No Known Allergies     Medication List       Accurate as of 09/16/16  2:19 PM. Always use your most recent med list.          meloxicam 15 MG tablet Commonly known as:  MOBIC TAKE 1 TABLET BY MOUTH EVERY DAY AS NEEDED FOR PAIN WITH FOOD       Health Maintenance:  Immunization History  Administered Date(s) Administered  . Tdap 07/05/2009   Tetanus: 2010  Pneumovax: N/A Flu vaccine: 2017 Zostavax: N/A DEXA: N/A Colonoscopy: Due this year EGD: N/A Eye exam: None Dentist: [facility]- q 6 months CXR 08/2013  Patient Care Team: Unk Pinto, MD as PCP - General (Internal Medicine)  Medical History:  Past Medical History:  Diagnosis Date  . Arthritis   . Hyperlipidemia   . Prediabetes   . Vitamin D  deficiency    Allergies No Known Allergies  SURGICAL HISTORY He  has a past surgical history that includes Orbital fracture repair (Left). FAMILY HISTORY His family history includes Alcohol abuse in his father; Arthritis in his mother; Diabetes in his father. SOCIAL HISTORY He  reports that he has never smoked. He has never used smokeless tobacco. He reports that he drinks alcohol. He reports that he does not use  drugs.  Review of Systems:  Review of Systems  Constitutional: Positive for malaise/fatigue. Negative for chills, diaphoresis, fever and weight loss.  HENT: Negative.   Eyes: Negative.   Respiratory: Negative.  Negative for shortness of breath.   Cardiovascular: Negative.  Negative for chest pain.  Gastrointestinal: Negative.   Genitourinary: Negative.        + ED  Musculoskeletal: Positive for joint pain. Negative for back pain, falls, myalgias and neck pain.  Skin: Negative.   Neurological: Negative.  Negative for weakness.  Psychiatric/Behavioral: Negative for depression, hallucinations, memory loss, substance abuse and suicidal ideas. The patient has insomnia. The patient is not nervous/anxious.    Physical Exam: Estimated body mass index is 25.11 kg/m as calculated from the following:   Height as of this encounter: 5\' 10"  (1.778 m).   Weight as of this encounter: 175 lb (79.4 kg). BP 126/78   Pulse 86   Temp 98.7 F (37.1 C)   Resp 16   Ht 5\' 10"  (1.778 m)   Wt 175 lb (79.4 kg)   SpO2 98%   BMI 25.11 kg/m  General Appearance: Well nourished, in no apparent distress.  Eyes: PERRLA, EOMs, conjunctiva no swelling or erythema, normal fundi and vessels.  Sinuses: No Frontal/maxillary tenderness  ENT/Mouth: Ext aud canals clear, normal light reflex with TMs without erythema, bulging. Good dentition. No erythema, swelling, or exudate on post pharynx. Tonsils not swollen or erythematous. Hearing normal.  Neck: Supple, thyroid normal. No bruits  Respiratory: Respiratory effort normal, BS equal bilaterally without rales, rhonchi, wheezing or stridor.  Cardio: RRR without murmurs, rubs or gallops. Brisk peripheral pulses without edema.  Chest: symmetric, with normal excursions and percussion.  Abdomen: Soft, nontender, no guarding, rebound, hernias, masses, or organomegaly. .  Lymphatics: Non tender without lymphadenopathy.  Genitourinary: defer Musculoskeletal: Full ROM all  peripheral extremities,5/5 strength, and normal gait.  Skin: Warm, dry without rashes, lesions, ecchymosis. Neuro: Cranial nerves intact, reflexes equal bilaterally. Normal muscle tone, no cerebellar symptoms. Sensation intact.  Psych: Awake and oriented X 3, normal affect, Insight and Judgment appropriate.   EKG: WNL no changes, early repolarization, no specific ST changes, no ischemia. AORTA SCAN: defer  Vicie Mutters 2:19 PM [facility]

## 2016-09-16 NOTE — Patient Instructions (Signed)
Simple math prevails.    1st - exercise does not produce significant weight loss - at best one converts fat into muscle , "bulks up", loses inches, but usually stays "weight neutral"     2nd - think of your body weightas a check book: If you eat more calories than you burn up - you save money or gain weight .... Or if you spend more money than you put in the check book, ie burn up more calories than you eat, then you lose weight     3rd - if you walk or run 1 mile, you burn up 100 calories - you have to burn up 3,500 calories to lose 1 pound, ie you have to walk/run 35 miles to lose 1 measly pound. So if you want to lose 10 #, then you have to walk/run 350 miles, so.... clearly exercise is not the solution.     4. So if you consume 1,500 calories, then you have to burn up the equivalent of 15 miles to stay weight neutral - It also stands to reason that if you consume 1,500 cal/day and don't lose weight, then you must be burning up about 1,500 cals/day to stay weight neutral.     5. If you really want to lose weight, you must cut your calorie intake 300 calories /day and at that rate you should lose about 1 # every 3 days.   6. Please purchase Dr Joel Fuhrman's book(s) "The End of Dieting" & "Eat to Live" . It has some great concepts and recipes.      

## 2016-09-17 LAB — URINALYSIS, ROUTINE W REFLEX MICROSCOPIC
Bilirubin Urine: NEGATIVE
Glucose, UA: NEGATIVE
HGB URINE DIPSTICK: NEGATIVE
Ketones, ur: NEGATIVE
LEUKOCYTES UA: NEGATIVE
NITRITE: NEGATIVE
PH: 6 (ref 5.0–8.0)
Protein, ur: NEGATIVE
Specific Gravity, Urine: 1.01 (ref 1.001–1.035)

## 2016-09-17 LAB — MICROALBUMIN / CREATININE URINE RATIO
Creatinine, Urine: 104 mg/dL (ref 20–370)
MICROALB UR: 0.2 mg/dL
MICROALB/CREAT RATIO: 2 ug/mg{creat} (ref <30)

## 2016-09-17 LAB — HIV ANTIBODY (ROUTINE TESTING W REFLEX): HIV 1&2 Ab, 4th Generation: NONREACTIVE

## 2016-09-17 LAB — VITAMIN D 25 HYDROXY (VIT D DEFICIENCY, FRACTURES): Vit D, 25-Hydroxy: 37 ng/mL (ref 30–100)

## 2016-09-17 NOTE — Progress Notes (Signed)
Pt aware of lab results & voiced understanding of those results.

## 2017-03-17 NOTE — Progress Notes (Signed)
6 MONTH FOLLOW UP  Assessment and Plan: Prediabetes Discussed general issues about diabetes pathophysiology and management., Educational material distributed., Suggested low cholesterol diet., Encouraged aerobic exercise., Discussed foot care., Reminded to get yearly retinal exam. - CBC with Differential/Platelet - BASIC METABOLIC PANEL WITH GFR - Hepatic function panel  Hyperlipidemia -continue medications, check lipids, decrease fatty foods, increase activity.    Discussed med's effects and SE's. Screening labs and tests as requested with regular follow-up as recommended. Future Appointments Date Time Provider [place]  09/17/2017 2:00 PM Kyle Mutters, PA-C [id number] None    HPI Patient presents for a follow up for preDM, chol, and insomnia  His blood pressure has been controlled at home, today their BP is BP: 122/88 He does not workout, due to lack of time but is very active at [employer], going full time at [employer] in Sept, 7 pm to 2-3 AM.  He denies chest pain, shortness of breath, dizziness.  He is not on cholesterol medication and denies myalgias. His cholesterol is at goal. The cholesterol last visit was:   Lab Results  Component Value Date   CHOL 198 09/16/2016   HDL 59 09/16/2016   LDLCALC 114 (H) 09/16/2016   TRIG 127 09/16/2016   CHOLHDL 3.4 09/16/2016  He has been working on diet and exercise for prediabetes,  and denies paresthesia of the feet, polydipsia, polyuria and visual disturbances. Last A1C in the office was (5.9 on 09/01/2013):  Lab Results  Component Value Date   HGBA1C 5.5 03/25/2016  Patient is not on Vitamin D supplement.   Lab Results  Component Value Date   VD25OH 37 09/16/2016   BMI is Body mass index is 24.99 kg/m., he is working on diet and exercise. Wt Readings from Last 3 Encounters:  03/18/17 166 lb 12.8 oz (75.7 kg)  09/16/16 175 lb (79.4 kg)  08/25/16 165 lb (74.8 kg)    Current Medications:  No current outpatient prescriptions  on file prior to visit.   No current facility-administered medications on file prior to visit.     Medical History:  Past Medical History:  Diagnosis Date  . Arthritis   . Hyperlipidemia   . Prediabetes   . Vitamin D deficiency    Allergies No Known Allergies  SURGICAL HISTORY- reviewed FAMILY HISTORY- reviewed SOCIAL HISTORY He  reports that he has never smoked. He has never used smokeless tobacco. He reports that he drinks alcohol. He reports that he does not use drugs. Works two jobs, [employer] in the evening Monday- friday and [employer] in [place] Monday-Friday. Sleeps 4-5 hours at night.    Review of Systems:  Review of Systems  Constitutional: Negative for chills, diaphoresis, fever, malaise/fatigue and weight loss.  HENT: Negative.   Eyes: Negative.   Respiratory: Negative.  Negative for shortness of breath.   Cardiovascular: Negative.  Negative for chest pain.  Gastrointestinal: Negative.   Genitourinary: Negative.        + ED  Musculoskeletal: Negative for back pain, falls, joint pain, myalgias and neck pain.  Skin: Negative.   Neurological: Negative.  Negative for weakness.  Psychiatric/Behavioral: Negative for depression, hallucinations, memory loss, substance abuse and suicidal ideas. The patient is not nervous/anxious and does not have insomnia.    Physical Exam: Estimated body mass index is 24.99 kg/m as calculated from the following:   Height as of this encounter: 5' 8.5" (1.74 m).   Weight as of this encounter: 166 lb 12.8 oz (75.7 kg). BP 122/88  Pulse 84   Temp 97.7 F (36.5 C)   Resp 14   Ht 5' 8.5" (1.74 m)   Wt 166 lb 12.8 oz (75.7 kg)   SpO2 97%   BMI 24.99 kg/m  General Appearance: Well nourished, in no apparent distress.  Eyes: PERRLA, EOMs, conjunctiva no swelling or erythema, normal fundi and vessels.  Sinuses: No Frontal/maxillary tenderness  ENT/Mouth: Ext aud canals clear, normal light reflex with TMs without erythema,  bulging. Good dentition. No erythema, swelling, or exudate on post pharynx. Tonsils not swollen or erythematous. Hearing normal.  Neck: Supple, thyroid normal. No bruits  Respiratory: Respiratory effort normal, BS equal bilaterally without rales, rhonchi, wheezing or stridor.  Cardio: RRR without murmurs, rubs or gallops. Brisk peripheral pulses without edema.  Chest: symmetric, with normal excursions and percussion.  Abdomen: Soft, nontender, no guarding, rebound, hernias, masses, or organomegaly. .  Lymphatics: Non tender without lymphadenopathy.  Musculoskeletal: Full ROM all peripheral extremities,5/5 strength, and normal gait.  Skin: Warm, dry without rashes, lesions, ecchymosis. Neuro: Cranial nerves intact, reflexes equal bilaterally. Normal muscle tone, no cerebellar symptoms. Sensation intact.  Psych: Awake and oriented X 3, normal affect, Insight and Judgment appropriate.   Kyle Lara 2:45 PM [facility]

## 2017-03-18 ENCOUNTER — Encounter: Payer: Self-pay | Admitting: Physician Assistant

## 2017-03-18 ENCOUNTER — Other Ambulatory Visit: Payer: Self-pay

## 2017-03-18 ENCOUNTER — Ambulatory Visit (INDEPENDENT_AMBULATORY_CARE_PROVIDER_SITE_OTHER): Payer: BLUE CROSS/BLUE SHIELD | Admitting: Physician Assistant

## 2017-03-18 VITALS — BP 122/88 | HR 84 | Temp 97.7°F | Resp 14 | Ht 68.5 in | Wt 166.8 lb

## 2017-03-18 DIAGNOSIS — E785 Hyperlipidemia, unspecified: Secondary | ICD-10-CM | POA: Diagnosis not present

## 2017-03-18 DIAGNOSIS — R7303 Prediabetes: Secondary | ICD-10-CM

## 2017-03-18 DIAGNOSIS — E559 Vitamin D deficiency, unspecified: Secondary | ICD-10-CM | POA: Diagnosis not present

## 2017-03-18 DIAGNOSIS — Z79899 Other long term (current) drug therapy: Secondary | ICD-10-CM | POA: Diagnosis not present

## 2017-03-18 LAB — CBC WITH DIFFERENTIAL/PLATELET
BASOS ABS: 138 {cells}/uL (ref 0–200)
Basophils Relative: 3 %
EOS PCT: 8 %
Eosinophils Absolute: 368 cells/uL (ref 15–500)
HEMATOCRIT: 41.8 % (ref 38.5–50.0)
HEMOGLOBIN: 14.1 g/dL (ref 13.2–17.1)
LYMPHS ABS: 1150 {cells}/uL (ref 850–3900)
LYMPHS PCT: 25 %
MCH: 30.8 pg (ref 27.0–33.0)
MCHC: 33.7 g/dL (ref 32.0–36.0)
MCV: 91.3 fL (ref 80.0–100.0)
MPV: 10.4 fL (ref 7.5–12.5)
Monocytes Absolute: 460 cells/uL (ref 200–950)
Monocytes Relative: 10 %
NEUTROS PCT: 54 %
Neutro Abs: 2484 cells/uL (ref 1500–7800)
Platelets: 261 10*3/uL (ref 140–400)
RBC: 4.58 MIL/uL (ref 4.20–5.80)
RDW: 14.2 % (ref 11.0–15.0)
WBC: 4.6 10*3/uL (ref 3.8–10.8)

## 2017-03-18 LAB — HEPATIC FUNCTION PANEL
ALT: 19 U/L (ref 9–46)
AST: 25 U/L (ref 10–35)
Albumin: 4.5 g/dL (ref 3.6–5.1)
Alkaline Phosphatase: 46 U/L (ref 40–115)
Bilirubin, Direct: 0.1 mg/dL (ref <=0.2)
Indirect Bilirubin: 0.6 mg/dL (ref 0.2–1.2)
TOTAL PROTEIN: 7 g/dL (ref 6.1–8.1)
Total Bilirubin: 0.7 mg/dL (ref 0.2–1.2)

## 2017-03-18 LAB — BASIC METABOLIC PANEL WITH GFR
BUN: 17 mg/dL (ref 7–25)
CALCIUM: 9.8 mg/dL (ref 8.6–10.3)
CO2: 25 mmol/L (ref 20–32)
CREATININE: 0.95 mg/dL (ref 0.70–1.33)
Chloride: 103 mmol/L (ref 98–110)
GFR, Est Non African American: >89 mL/min (ref >=60)
GLUCOSE: 67 mg/dL (ref 65–99)
Potassium: 4.3 mmol/L (ref 3.5–5.3)
SODIUM: 137 mmol/L (ref 135–146)

## 2017-03-18 MED ORDER — MELOXICAM 15 MG PO TABS: ORAL_TABLET | ORAL | 1 refills | Status: DC

## 2017-03-18 NOTE — Patient Instructions (Signed)
  Vitamin D goal is between 60-80  Please make sure that you are taking your Vitamin D as directed.   It is very important as a natural anti-inflammatory   helping hair, skin, and nails, as well as reducing stroke and heart attack risk.   It helps your bones and helps with mood.  We want you on at least 5000 IU daily  It also decreases numerous cancer risks so please take it as directed.   Low Vit D is associated with a 200-300% higher risk for CANCER   and 200-300% higher risk for HEART   ATTACK  &  STROKE.    ......................................  It is also associated with higher death rate at younger ages,   autoimmune diseases like Rheumatoid arthritis, Lupus, Multiple Sclerosis.     Also many other serious conditions, like depression, Alzheimer's  Dementia, infertility, muscle aches, fatigue, fibromyalgia - just to name a few.  +++++++++++++++++++  Can get liquid vitamin D from [employer]  OR here in [place] at  [facility] [address], [place], [place] [address] Or you can try [employer]    

## 2017-03-19 NOTE — Progress Notes (Signed)
Pt aware of lab results & voiced understanding of those results.

## 2017-04-17 ENCOUNTER — Other Ambulatory Visit: Payer: Self-pay | Admitting: Physician Assistant

## 2017-04-17 MED ORDER — ALPRAZOLAM 0.5 MG PO TABS: ORAL_TABLET | ORAL | 0 refills | Status: DC

## 2017-04-22 NOTE — Progress Notes (Signed)
XANAX CALLED INTO PHARMACY ON 11TH SEPT 2018 AT 10:31AM BY DD

## 2017-09-17 ENCOUNTER — Encounter: Payer: Self-pay | Admitting: Physician Assistant
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
## 2020-01-31 ENCOUNTER — Other Ambulatory Visit: Payer: Self-pay

## 2020-01-31 ENCOUNTER — Ambulatory Visit
Admission: EM | Admit: 2020-01-31 | Discharge: 2020-01-31 | Disposition: A | Discharge status: Home / Self Care | Payer: Managed Care, Other (non HMO) | Attending: Physician Assistant | Admitting: Physician Assistant

## 2020-01-31 DIAGNOSIS — M79674 Pain in right toe(s): Secondary | ICD-10-CM | POA: Diagnosis not present

## 2020-01-31 MED ORDER — INDOMETHACIN 50 MG PO CAPS
50.0000 mg | ORAL_CAPSULE | Freq: Three times a day (TID) | ORAL | 0 refills | Status: DC | PRN
Start: 2020-01-31 — End: 2020-05-29

## 2020-01-31 NOTE — ED Provider Notes (Signed)
[facility]    CSN: [id number] Arrival date & time: 01/31/20  0841      History   Chief Complaint Chief Complaint  Patient presents with  . Gout    HPI Kyle Lara. is a 54 y.o. male.   54 year old male with history of HLD, arthritis, prediabetes comes in for 3 day history of right great toe pain. Denies injury/trauma. States had swelling, possible redness to the right great toe after drinking beer. Pain with slight touch and occasional shooting pain. Denies spreading redness, fever. States after taking some otc pain medicines, swelling has improved, though pain still present. States has had similar episodes in the past with self diagnosis of gout.      Past Medical History:  Diagnosis Date  . Arthritis   . Hyperlipidemia   . Prediabetes   . Vitamin D deficiency     Patient Active Problem List   Diagnosis Date Noted  . Hand discomfort 09/01/2015  . Arthritis 09/05/2014  . Insomnia 09/05/2014  . ED (erectile dysfunction) 09/05/2014  . Prediabetes   . Vitamin D deficiency   . Hyperlipidemia     Past Surgical History:  Procedure Laterality Date  . ORBITAL FRACTURE SURGERY Left        Home Medications    Prior to Admission medications   Medication Sig Start Date End Date Taking? Authorizing Provider  indomethacin (INDOCIN) 50 MG capsule Take 1 capsule (50 mg total) by mouth 3 (three) times daily as needed. 01/31/20   Ok Edwards, PA-C    Family History Family History  Problem Relation Age of Onset  . Arthritis Mother   . Alcohol abuse Father   . Diabetes Father     Social History Social History   Tobacco Use  . Smoking status: Never Smoker  . Smokeless tobacco: Never Used  Substance Use Topics  . Alcohol use: Yes    Alcohol/week: 0.0 standard drinks    Comment: weekends-2-3 beers  . Drug use: No     Allergies   Patient has no known allergies.   Review of Systems Review of Systems  Reason unable to perform ROS: See HPI as  above.     Physical Exam Triage Vital Signs ED Triage Vitals  Enc Vitals Group     BP 01/31/20 0852 (!) 137/92     Pulse Rate 01/31/20 0852 73     Resp 01/31/20 0852 18     Temp 01/31/20 0852 98 F (36.7 C)     Temp Source 01/31/20 0852 Oral     SpO2 01/31/20 0852 98 %     Weight --      Height --      Head Circumference --      Peak Flow --      Pain Score 01/31/20 0855 2     Pain Loc --      Pain Edu? --      Excl. in [place]? --    No data found.  Updated Vital Signs BP (!) 137/92 (BP Location: Left Arm)   Pulse 73   Temp 98 F (36.7 C) (Oral)   Resp 18   SpO2 98%   Physical Exam Constitutional:      General: He is not in acute distress.    Appearance: Normal appearance. He is well-developed. He is not toxic-appearing or diaphoretic.  HENT:     Head: Normocephalic and atraumatic.  Eyes:     Conjunctiva/sclera: Conjunctivae normal.  Pupils: Pupils are equal, round, and reactive to light.  Pulmonary:     Effort: Pulmonary effort is normal. No respiratory distress.     Comments: Speaking in full sentences without difficulty Musculoskeletal:     Cervical back: Normal range of motion and neck supple.     Comments: No swelling, erythema, warmth, contusion seen. Mild tenderness to palpation of right 1st MTP joint. Full ROM. Sensation intact and equal. Pedal pulse 2+  Skin:    General: Skin is warm and dry.  Neurological:     Mental Status: He is alert and oriented to person, place, and time.      UC Treatments / Results  Labs (all labs ordered are listed, but only abnormal results are displayed) Labs Reviewed - No data to display  EKG   Radiology No results found.  Procedures Procedures (including critical care time)  Medications Ordered in UC Medications - No data to display  Initial Impression / Assessment and Plan / UC Course  I have reviewed the triage vital signs and the nursing notes.  Pertinent labs & imaging results that were available  during my care of the patient were reviewed by me and considered in my medical decision making (see chart for details).    History consistent with gout. Current exam without worries for septic joint. Will provide indomethacin as needed. Push fluids. Discussed diet changes needed. Return precautions given. Otherwise, follow up with PCP for further management needed.  Final Clinical Impressions(s) / UC Diagnoses   Final diagnoses:  Great toe pain, right    ED Prescriptions    Medication Sig Dispense Auth. Provider   indomethacin (INDOCIN) 50 MG capsule Take 1 capsule (50 mg total) by mouth 3 (three) times daily as needed. 21 capsule Ok Edwards, PA-C     PDMP not reviewed this encounter.   Ok Edwards, PA-C 01/31/20 [phone]

## 2020-01-31 NOTE — Discharge Instructions (Signed)
Start indomethacin as needed when having gout flares: red, swelling, warmth, pain to the great toe joint. Keep hydrated, urine should be clear to pale yellow in color. Follow up with PCP for further evaluation and maintenance needed. If having pain/swelling/redness to the joint, having trouble moving joint, fever, go to the emergency department for further evaluation.

## 2020-01-31 NOTE — ED Triage Notes (Signed)
Pt states had gout to rt foot since Friday. States swelling has subsided after using OTC meds. Pt states having some pain.

## 2020-04-06 ENCOUNTER — Telehealth: Payer: BLUE CROSS/BLUE SHIELD | Admitting: Internal Medicine

## 2020-05-29 ENCOUNTER — Encounter: Payer: Self-pay | Admitting: Internal Medicine

## 2020-05-29 ENCOUNTER — Telehealth (INDEPENDENT_AMBULATORY_CARE_PROVIDER_SITE_OTHER): Payer: Managed Care, Other (non HMO) | Admitting: Internal Medicine

## 2020-05-29 DIAGNOSIS — R7303 Prediabetes: Secondary | ICD-10-CM

## 2020-05-29 DIAGNOSIS — Z7689 Persons encountering health services in other specified circumstances: Secondary | ICD-10-CM

## 2020-05-29 DIAGNOSIS — E559 Vitamin D deficiency, unspecified: Secondary | ICD-10-CM

## 2020-05-29 DIAGNOSIS — S41111A Laceration without foreign body of right upper arm, initial encounter: Secondary | ICD-10-CM

## 2020-05-29 DIAGNOSIS — E785 Hyperlipidemia, unspecified: Secondary | ICD-10-CM | POA: Diagnosis not present

## 2020-05-29 NOTE — Patient Instructions (Incomplete)
Thank you for choosing Primary Care at [facility] to be your medical home!    Kyle Lara. was seen by Melina Schools, DO today.   Kyle Shaggy Jr.'s primary care provider is Phill Myron, DO.   For the best care possible, you should try to see Phill Myron, DO whenever you come to the clinic.   We look forward to seeing you again soon!  If you have any questions about your visit today, please call us at [phone] or feel free to reach your primary care provider via [place].

## 2020-05-29 NOTE — Progress Notes (Signed)
Virtual Visit via Telephone Note  I connected with Kyle Lara., on 05/29/2020 at 10:10 AM by telephone due to the COVID-19 pandemic and verified that I am speaking with the correct person using two identifiers.   Consent: I discussed the limitations, risks, security and privacy concerns of performing an evaluation and management service by telephone and the availability of in person appointments. I also discussed with the patient that there may be a patient responsible charge related to this service. The patient expressed understanding and agreed to proceed.   Location of Patient: Home   Location of Provider: Clinic    Persons participating in Telemedicine visit: Kyle Lara [facility] Dr. Juleen China      History of Present Illness: Patient has a visit to establish care. Was seeing PCP previously but has been >1 year since last visit. Patient has a PMH of prediabtes, HLD, Vit D deficiency. Not currently on any prescription medications. No past surgical history. Not a smoker.   Has a cut on his arm. This occurred from a fall where he scrapped his arm. [place] just over a week. Thinks it is improving. No pustular drainage, no surrounding erythema. Not particularly painful at this point but needs to watch what he does so it doesn't open up again. His range of motion is normal. Strength unchanged.    Past Medical History:  Diagnosis Date   Arthritis    Hyperlipidemia    Prediabetes    Vitamin D deficiency    No Known Allergies  No current outpatient medications on file prior to visit.   No current facility-administered medications on file prior to visit.    Observations/Objective: NAD. Speaking clearly.  Work of breathing normal.  Alert and oriented. Mood appropriate.   Assessment and Plan: 1. Encounter to establish care Reviewed patient's PMH, social history, surgical history, and medications.  Is overdue for annual exam, screening blood work, and health  maintenance topics. Have asked patient to return for visit to address these items.  Requests PSA for prostate cancer screening.  Will need Tdap and Influenza.  Will need discussion of colon cancer screening.   2. Prediabetes History of prediabetes. Last A1c 2017 well controlled with result of 5.5%. Will need repeat screening.   3. Vitamin D deficiency History of low Vit D with last measurement in normal range with result of 37 in 2018. Not currently on any supplements.   4. Hyperlipidemia, unspecified hyperlipidemia type History of HLD. Will need to obtain lipid panel and calculate ASCVD risk score.   5. Laceration of right upper extremity, initial encounter Healing well. No concerns for secondary infection. Discussed wound care and return precautions.    Follow Up Instructions: Annual exam 11/16    I discussed the assessment and treatment plan with the patient. The patient was provided an opportunity to ask questions and all were answered. The patient agreed with the plan and demonstrated an understanding of the instructions.   The patient was advised to call back or seek an in-person evaluation if the symptoms worsen or if the condition fails to improve as anticipated.     I provided 24 minutes total of non-face-to-face time during this encounter including median intraservice time, reviewing previous notes, investigations, ordering medications, medical decision making, coordinating care and patient verbalized understanding at the end of the visit.    Phill Myron, D.O. Primary Care at [facility]  05/29/2020, 10:10 AM

## 2020-06-26 ENCOUNTER — Other Ambulatory Visit: Payer: Self-pay

## 2020-06-26 DIAGNOSIS — Z1211 Encounter for screening for malignant neoplasm of colon: Secondary | ICD-10-CM

## 2020-06-26 NOTE — Progress Notes (Signed)
am

## 2020-06-27 ENCOUNTER — Encounter: Payer: Managed Care, Other (non HMO) | Admitting: Family Medicine

## 2020-06-27 DIAGNOSIS — Z0000 Encounter for general adult medical examination without abnormal findings: Secondary | ICD-10-CM

## 2020-06-27 DIAGNOSIS — Z1159 Encounter for screening for other viral diseases: Secondary | ICD-10-CM

## 2020-06-27 DIAGNOSIS — Z125 Encounter for screening for malignant neoplasm of prostate: Secondary | ICD-10-CM

## 2020-06-27 DIAGNOSIS — Z13228 Encounter for screening for other metabolic disorders: Secondary | ICD-10-CM

## 2020-06-27 DIAGNOSIS — Z130 Encounter for screening for diseases of the blood and blood-forming organs and certain disorders involving the immune mechanism: Secondary | ICD-10-CM

## 2020-06-27 DIAGNOSIS — Z1329 Encounter for screening for other suspected endocrine disorder: Secondary | ICD-10-CM

## 2020-06-27 DIAGNOSIS — R7303 Prediabetes: Secondary | ICD-10-CM

## 2020-06-27 DIAGNOSIS — Z13220 Encounter for screening for lipoid disorders: Secondary | ICD-10-CM

## 2020-08-24 ENCOUNTER — Telehealth: Payer: Self-pay | Admitting: Internal Medicine

## 2020-08-24 NOTE — Telephone Encounter (Signed)
Called patient and LVM to let him know that his appointment for Monday January the 17th had been cancelled due to the provider being out of the office. Advised patient to call back [phone] to reschedule.

## 2020-08-28 ENCOUNTER — Encounter: Payer: Managed Care, Other (non HMO) | Admitting: Internal Medicine

## 2020-09-19 ENCOUNTER — Other Ambulatory Visit: Payer: Self-pay

## 2020-09-19 ENCOUNTER — Ambulatory Visit (INDEPENDENT_AMBULATORY_CARE_PROVIDER_SITE_OTHER): Payer: Managed Care, Other (non HMO) | Admitting: Internal Medicine

## 2020-09-19 ENCOUNTER — Encounter: Payer: Self-pay | Admitting: Internal Medicine

## 2020-09-19 VITALS — BP 144/86 | HR 73 | Temp 97.9°F | Resp 16 | Ht 68.0 in | Wt 175.8 lb

## 2020-09-19 DIAGNOSIS — R7303 Prediabetes: Secondary | ICD-10-CM

## 2020-09-19 DIAGNOSIS — E559 Vitamin D deficiency, unspecified: Secondary | ICD-10-CM | POA: Diagnosis not present

## 2020-09-19 DIAGNOSIS — R030 Elevated blood-pressure reading, without diagnosis of hypertension: Secondary | ICD-10-CM

## 2020-09-19 DIAGNOSIS — Z0000 Encounter for general adult medical examination without abnormal findings: Secondary | ICD-10-CM

## 2020-09-19 DIAGNOSIS — E785 Hyperlipidemia, unspecified: Secondary | ICD-10-CM | POA: Diagnosis not present

## 2020-09-19 DIAGNOSIS — Z1159 Encounter for screening for other viral diseases: Secondary | ICD-10-CM

## 2020-09-19 DIAGNOSIS — Z125 Encounter for screening for malignant neoplasm of prostate: Secondary | ICD-10-CM

## 2020-09-19 DIAGNOSIS — Z1211 Encounter for screening for malignant neoplasm of colon: Secondary | ICD-10-CM

## 2020-09-19 DIAGNOSIS — Z130 Encounter for screening for diseases of the blood and blood-forming organs and certain disorders involving the immune mechanism: Secondary | ICD-10-CM

## 2020-09-19 NOTE — Progress Notes (Signed)
Subjective:    Kyle Lara. - 55 y.o. male MRN [id number]  Date of birth: January 25, 1966  HPI  Kyle Lara. is here for annual exam. Has no concerns today. Reports monogamous with girlfriend and declines STD testing.   Depression screen [facility] 2/9 09/19/2020 05/29/2020  Decreased Interest 0 0  Down, Depressed, Hopeless 0 0  PHQ - 2 Score 0 0  Altered sleeping 0 -  Tired, decreased energy 0 -  Change in appetite 0 -  Feeling bad or failure about yourself  0 -  Trouble concentrating 0 -  Moving slowly or fidgety/restless 0 -  Suicidal thoughts 0 -  PHQ-9 Score 0 -  Difficult doing work/chores Not difficult at all -    Hearing Screening   Method: Audiometry   125Hz  250Hz  500Hz  1000Hz  2000Hz  3000Hz  4000Hz  6000Hz  8000Hz   Right ear:   Pass Pass Pass  Pass    Left ear:   Pass Pass Pass  Pass    Comments: 20dBHL   Visual Acuity Screening   Right eye Left eye Both eyes  Without correction: 20/30 20/25 20/25   With correction:       Health Maintenance:  Health Maintenance Due  Topic Date Due  . Hepatitis C Screening  Never done  . COLONOSCOPY (Pts 45-65yrs Insurance coverage will need to be confirmed)  Never done    -  reports that he has never smoked. He has never used smokeless tobacco. - Review of Systems: Per HPI. - Past Medical History: Patient Active Problem List   Diagnosis Date Noted  . Arthritis 09/05/2014  . Insomnia 09/05/2014  . ED (erectile dysfunction) 09/05/2014  . Prediabetes   . Vitamin D deficiency   . Hyperlipidemia    - Medications: reviewed and updated   Objective:   Physical Exam BP (!) 143/97 (BP Location: Right Arm, Patient Position: Sitting, Cuff Size: Normal)   Pulse 73   Temp 97.9 F (36.6 C) (Oral)   Resp 16   Ht 5\' 8"  (1.727 m)   Wt 175 lb 12.8 oz (79.7 kg)   SpO2 96%   BMI 26.73 kg/m  Physical Exam Constitutional:      Appearance: He is not diaphoretic.  HENT:     Head: Normocephalic and atraumatic.     Mouth/Throat:      Mouth: Oropharynx is clear and moist.      Comments: TMs normal bilaterally Eyes:     Extraocular Movements: EOM normal.     Conjunctiva/sclera: Conjunctivae normal.     Pupils: Pupils are equal, round, and reactive to light.  Neck:     Thyroid: No thyromegaly.  Cardiovascular:     Rate and Rhythm: Normal rate and regular rhythm.     Pulses: Intact distal pulses.     Heart sounds: Normal heart sounds. No murmur heard.   Pulmonary:     Effort: Pulmonary effort is normal. No respiratory distress.     Breath sounds: Normal breath sounds. No wheezing.  Abdominal:     General: Bowel sounds are normal. There is no distension.     Palpations: Abdomen is soft.     Tenderness: There is no abdominal tenderness. There is no guarding or rebound.  Musculoskeletal:        General: No deformity or edema. Normal range of motion.     Cervical back: Normal range of motion and neck supple.  Lymphadenopathy:     Cervical: No cervical adenopathy.  Skin:    General: Skin is  warm and dry.     Findings: No rash.  Neurological:     Mental Status: He is alert and oriented to person, place, and time.     Gait: Gait is intact.  Psychiatric:        Mood and Affect: Mood and affect normal.        Judgment: Judgment normal.            Assessment & Plan:   1. Encounter for annual physical exam Counseled on 150 minutes of exercise per week, healthy eating (including decreased daily intake of saturated fats, cholesterol, added sugars, sodium), STI prevention, routine healthcare maintenance.  2. Hyperlipidemia, unspecified hyperlipidemia type History of HLD. Will need to obtain lipid panel and calculate ASCVD risk score.  - Comprehensive metabolic panel - Lipid panel  3. Prediabetes History of prediabetes. Last A1c 2017 well controlled with result of 5.5%. Will need repeat screening.  - Hemoglobin A1c  4. Vitamin D deficiency History of low Vit D with last measurement in normal range with  result of 37 in 2018. Not currently on any supplements.  - VITAMIN D 25 Hydroxy (Vit-D Deficiency, Fractures)  5. Screening for deficiency anemia - CBC  6. Screening for prostate cancer Discussed risk vs. benefit of screening for prostate cancer. Patient opted in.  - PSA  7. Need for hepatitis C screening test - Hepatitis C antibody  8. Screening for colon cancer - Ambulatory referral to Gastroenterology  9. Elevated blood pressure reading without diagnosis of hypertension BP 143/97 >144/86. No prior history of HTN. Will need to return in one month for monitoring. Asymptomatic.    Phill Myron, D.O. 09/19/2020, 9:29 AM Primary Care at [facility]

## 2020-09-20 LAB — COMPREHENSIVE METABOLIC PANEL
ALT: 16 IU/L (ref 0–44)
AST: 19 IU/L (ref 0–40)
Albumin/Globulin Ratio: 1.9 (ref 1.2–2.2)
Albumin: 4.6 g/dL (ref 3.8–4.9)
Alkaline Phosphatase: 52 IU/L (ref 44–121)
BUN/Creatinine Ratio: 17 (ref 9–20)
BUN: 16 mg/dL (ref 6–24)
Bilirubin Total: 0.3 mg/dL (ref 0.0–1.2)
CO2: 21 mmol/L (ref 20–29)
Calcium: 9.6 mg/dL (ref 8.7–10.2)
Chloride: 103 mmol/L (ref 96–106)
Creatinine, Ser: 0.96 mg/dL (ref 0.76–1.27)
GFR calc Af Amer: 103 mL/min/{1.73_m2} (ref >59)
GFR calc non Af Amer: 89 mL/min/{1.73_m2} (ref >59)
Globulin, Total: 2.4 g/dL (ref 1.5–4.5)
Glucose: 99 mg/dL (ref 65–99)
Potassium: 4.6 mmol/L (ref 3.5–5.2)
Sodium: 139 mmol/L (ref 134–144)
Total Protein: 7 g/dL (ref 6.0–8.5)

## 2020-09-20 LAB — LIPID PANEL
Chol/HDL Ratio: 3.6 ratio (ref 0.0–5.0)
Cholesterol, Total: 208 mg/dL — ABNORMAL HIGH (ref 100–199)
HDL: 57 mg/dL (ref >39)
LDL Chol Calc (NIH): 138 mg/dL — ABNORMAL HIGH (ref 0–99)
Triglycerides: 74 mg/dL (ref 0–149)
VLDL Cholesterol Cal: 13 mg/dL (ref 5–40)

## 2020-09-20 LAB — CBC
Hematocrit: 42.7 % (ref 37.5–51.0)
Hemoglobin: 14.4 g/dL (ref 13.0–17.7)
MCH: 30.5 pg (ref 26.6–33.0)
MCHC: 33.7 g/dL (ref 31.5–35.7)
MCV: 91 fL (ref 79–97)
Platelets: 250 10*3/uL (ref 150–450)
RBC: 4.72 x10E6/uL (ref 4.14–5.80)
RDW: 12.4 % (ref 11.6–15.4)
WBC: 5.1 10*3/uL (ref 3.4–10.8)

## 2020-09-20 LAB — HEMOGLOBIN A1C
Est. average glucose Bld gHb Est-mCnc: 123 mg/dL
Hgb A1c MFr Bld: 5.9 % — ABNORMAL HIGH (ref 4.8–5.6)

## 2020-09-20 LAB — PSA: Prostate Specific Ag, Serum: 0.5 ng/mL (ref 0.0–4.0)

## 2020-09-20 LAB — HEPATITIS C ANTIBODY: Hep C Virus Ab: <0.1 s/co ratio (ref 0.0–0.9)

## 2020-09-20 LAB — VITAMIN D 25 HYDROXY (VIT D DEFICIENCY, FRACTURES): Vit D, 25-Hydroxy: 14.3 ng/mL — ABNORMAL LOW (ref 30.0–100.0)

## 2020-09-27 ENCOUNTER — Other Ambulatory Visit: Payer: Self-pay | Admitting: Internal Medicine

## 2020-09-27 DIAGNOSIS — E559 Vitamin D deficiency, unspecified: Secondary | ICD-10-CM

## 2020-09-27 DIAGNOSIS — E785 Hyperlipidemia, unspecified: Secondary | ICD-10-CM

## 2020-09-27 MED ORDER — VITAMIN D (ERGOCALCIFEROL) 1.25 MG (50000 UNIT) PO CAPS: 50000.0000 [IU] | ORAL_CAPSULE | ORAL | 0 refills | Status: AC

## 2020-09-29 NOTE — Progress Notes (Signed)
Also see staff message re: low vit d.

## 2020-10-04 ENCOUNTER — Telehealth: Payer: Self-pay

## 2020-10-04 NOTE — Telephone Encounter (Signed)
-----   Message from Nicolette Bang, DO sent at 09/27/2020  6:33 PM EST ----- Forgot to include on result note that Vit D was very low at 14. Start 12 weeks of once week 50,000 IU supplement and then will need to monitor labs.

## 2020-10-04 NOTE — Telephone Encounter (Signed)
UTC to discuss, LVM x3, UTC letter mailed to address on file

## 2020-10-20 NOTE — Telephone Encounter (Signed)
Pt returning call for lab results.  Call after 11:00 am to before 2pm due to work

## 2020-10-20 NOTE — Telephone Encounter (Signed)
Pt name and DOB verified. Patient aware of results and result note per Dr. Juleen China. Patient verbalized understanding to result note.

## 2020-11-14 ENCOUNTER — Ambulatory Visit (AMBULATORY_SURGERY_CENTER): Payer: Self-pay | Admitting: *Deleted

## 2020-11-14 ENCOUNTER — Other Ambulatory Visit: Payer: Self-pay

## 2020-11-14 VITALS — Ht 68.75 in | Wt 173.8 lb

## 2020-11-14 DIAGNOSIS — Z1211 Encounter for screening for malignant neoplasm of colon: Secondary | ICD-10-CM

## 2020-11-14 NOTE — Progress Notes (Signed)

## 2020-11-23 ENCOUNTER — Encounter: Payer: Self-pay | Admitting: Gastroenterology

## 2020-11-28 ENCOUNTER — Other Ambulatory Visit: Payer: Self-pay

## 2020-11-28 ENCOUNTER — Ambulatory Visit (AMBULATORY_SURGERY_CENTER): Payer: Managed Care, Other (non HMO) | Admitting: Gastroenterology

## 2020-11-28 ENCOUNTER — Encounter: Payer: Self-pay | Admitting: Gastroenterology

## 2020-11-28 VITALS — BP 130/92 | HR 73 | Temp 97.5°F | Resp 19 | Ht 68.0 in | Wt 173.0 lb

## 2020-11-28 DIAGNOSIS — K621 Rectal polyp: Secondary | ICD-10-CM | POA: Diagnosis not present

## 2020-11-28 DIAGNOSIS — D122 Benign neoplasm of ascending colon: Secondary | ICD-10-CM

## 2020-11-28 DIAGNOSIS — D128 Benign neoplasm of rectum: Secondary | ICD-10-CM | POA: Diagnosis not present

## 2020-11-28 DIAGNOSIS — Z1211 Encounter for screening for malignant neoplasm of colon: Secondary | ICD-10-CM

## 2020-11-28 MED ORDER — SODIUM CHLORIDE 0.9 % IV SOLN: 500.0000 mL | Freq: Once | INTRAVENOUS | Status: DC

## 2020-11-28 NOTE — Progress Notes (Signed)
Called to room to assist during endoscopic procedure.  Patient ID and intended procedure confirmed with present staff. Received instructions for my participation in the procedure from the performing physician.  

## 2020-11-28 NOTE — Progress Notes (Signed)
Report given to PACU, vss 

## 2020-11-28 NOTE — Progress Notes (Signed)
Vs by CW; previsit with EM.  No changes to health hx since previsit

## 2020-11-28 NOTE — Patient Instructions (Signed)
YOU HAD AN ENDOSCOPIC PROCEDURE TODAY AT [facility]:   Refer to the procedure report that was given to you for any specific questions about what was found during the examination.  If the procedure report does not answer your questions, please call your gastroenterologist to clarify.  If you requested that your care partner not be given the details of your procedure findings, then the procedure report has been included in a sealed envelope for you to review at your convenience later.  YOU SHOULD EXPECT: Some feelings of bloating in the abdomen. Passage of more gas than usual.  Walking can help get rid of the air that was put into your GI tract during the procedure and reduce the bloating. If you had a lower endoscopy (such as a colonoscopy or flexible sigmoidoscopy) you may notice spotting of blood in your stool or on the toilet paper. If you underwent a bowel prep for your procedure, you may not have a normal bowel movement for a few days.  Please Note:  You might notice some irritation and congestion in your nose or some drainage.  This is from the oxygen used during your procedure.  There is no need for concern and it should clear up in a day or so.  SYMPTOMS TO REPORT IMMEDIATELY:   Following lower endoscopy (colonoscopy or flexible sigmoidoscopy):  Excessive amounts of blood in the stool  Significant tenderness or worsening of abdominal pains  Swelling of the abdomen that is new, acute  Fever of 100F or higher    For urgent or emergent issues, a gastroenterologist can be reached at any hour by calling [phone]. Do not use MyChart messaging for urgent concerns.    DIET:  We do recommend a small meal at first, but then you may proceed to your regular diet.  Drink plenty of fluids but you should avoid alcoholic beverages for 24 hours.  ACTIVITY:  You should plan to take it easy for the rest of today and you should NOT DRIVE or use heavy machinery until tomorrow  (because of the sedation medicines used during the test).    FOLLOW UP: Our staff will call the number listed on your records 48-72 hours following your procedure to check on you and address any questions or concerns that you may have regarding the information given to you following your procedure. If we do not reach you, we will leave a message.  We will attempt to reach you two times.  During this call, we will ask if you have developed any symptoms of COVID 19. If you develop any symptoms (ie: fever, flu-like symptoms, shortness of breath, cough etc.) before then, please call [phone].  If you test positive for Covid 19 in the 2 weeks post procedure, please call and report this information to [place].    If any biopsies were taken you will be contacted by phone or by letter within the next 1-3 weeks.  Please call us at [phone] if you have not heard about the biopsies in 3 weeks.    SIGNATURES/CONFIDENTIALITY: You and/or your care partner have signed paperwork which will be entered into your electronic medical record.  These signatures attest to the fact that that the information above on your After Visit Summary has been reviewed and is understood.  Full responsibility of the confidentiality of this discharge information lies with you and/or your care-partner.   Resume medications. Information given on polyps and High Fiber diet.

## 2020-11-28 NOTE — Op Note (Signed)
[facility] Patient Name: Kyle Lara Procedure Date: 11/28/2020 10:28 AM MRN: [id number] Endoscopist: Justice Britain , MD Age: 55 Referring MD:  Date of Birth: [birth year] Gender: Male Account #: [id number] Procedure:                Colonoscopy Indications:              Screening for colorectal malignant neoplasm, This                            is the patient's first colonoscopy Medicines:                Monitored Anesthesia Care Procedure:                Pre-Anesthesia Assessment:                           - Prior to the procedure, a History and Physical                            was performed, and patient medications and                            allergies were reviewed. The patient's tolerance of                            previous anesthesia was also reviewed. The risks                            and benefits of the procedure and the sedation                            options and risks were discussed with the patient.                            All questions were answered, and informed consent                            was obtained. Prior Anticoagulants: The patient has                            taken no previous anticoagulant or antiplatelet                            agents. ASA Grade Assessment: II - A patient with                            mild systemic disease. After reviewing the risks                            and benefits, the patient was deemed in                            satisfactory condition to undergo the procedure.  After obtaining informed consent, the colonoscope                            was passed under direct vision. Throughout the                            procedure, the patient's blood pressure, pulse, and                            oxygen saturations were monitored continuously. The                            Olympus CF-HQ190L ([id number]) Colonoscope was                            introduced through the anus  and advanced to the 5                            cm into the ileum. The colonoscopy was performed                            without difficulty. The patient tolerated the                            procedure. The quality of the bowel preparation was                            good. The terminal ileum, ileocecal valve,                            appendiceal orifice, and rectum were photographed. Scope In: 10:44:37 AM Scope Out: 11:00:27 AM Scope Withdrawal Time: 0 hours 12 minutes 6 seconds  Total Procedure Duration: 0 hours 15 minutes 50 seconds  Findings:                 The digital rectal exam was normal. Pertinent                            negatives include no palpable rectal lesions.                           The terminal ileum and ileocecal valve appeared                            normal.                           Three sessile polyps were found in the rectum (2)                            and ascending colon (1). The polyps were 2 to 4 mm                            in size. These polyps were removed with a cold  snare. Resection and retrieval were complete.                           Normal mucosa was found in the entire colon                            otherwise.                           Non-bleeding non-thrombosed internal hemorrhoids                            were found during retroflexion, during perianal                            exam and during digital exam. The hemorrhoids were                            Grade II (internal hemorrhoids that prolapse but                            reduce spontaneously). Complications:            No immediate complications. Estimated Blood Loss:     Estimated blood loss was minimal. Impression:               - The examined portion of the ileum was normal.                           - Three 2 to 4 mm polyps in the rectum and in the                            ascending colon, removed with a cold snare.                             Resected and retrieved.                           - Normal mucosa in the entire examined colon                            otherwise.                           - Non-bleeding non-thrombosed internal hemorrhoids. Recommendation:           - The patient will be observed post-procedure,                            until all discharge criteria are met.                           - Discharge patient to home.                           - Patient has a contact number available for  emergencies. The signs and symptoms of potential                            delayed complications were discussed with the                            patient. Return to normal activities tomorrow.                            Written discharge instructions were provided to the                            patient.                           - High fiber diet.                           - Use FiberCon 1-2 tablets PO daily.                           - Continue present medications.                           - Await pathology results.                           - Repeat colonoscopy in 3/12/16/08 years for                            surveillance based on pathology results and                            findings of adenomatous tissue.                           - The findings and recommendations were discussed                            with the patient.                           - The findings and recommendations were discussed                            with the designated responsible adult. Justice Britain, MD 11/28/2020 11:06:12 AM

## 2020-11-30 ENCOUNTER — Telehealth: Payer: Self-pay

## 2020-11-30 NOTE — Telephone Encounter (Signed)
  Follow up Call-  Call back number 11/28/2020  Post procedure Call Back phone  # [phone]  Permission to leave phone message Yes  Some recent data might be hidden     Patient questions:  Do you have a fever, pain , or abdominal swelling? No. Pain Score  0 *  Have you tolerated food without any problems? Yes.    Have you been able to return to your normal activities? Yes.    Do you have any questions about your discharge instructions: Diet   No. Medications  No. Follow up visit  No.  Do you have questions or concerns about your Care? No.  Actions: * If pain score is 4 or above: No action needed, pain <4.  1. Have you developed a fever since your procedure? no  2.   Have you had an respiratory symptoms (SOB or cough) since your procedure? no  3.   Have you tested positive for COVID 19 since your procedure no  4.   Have you had any family members/close contacts diagnosed with the COVID 19 since your procedure?  no   If yes to any of these questions please route to Joylene John, RN and Joella Prince, RN

## 2020-12-04 ENCOUNTER — Encounter: Payer: Self-pay | Admitting: Gastroenterology
